# Patient Record
Sex: Female | Born: 1945 | Race: White | Hispanic: No | Marital: Married | Smoking: Never smoker
Health system: Southern US, Community
[De-identification: ages and names within clinical notes are randomized; demographics above are authoritative.]

## PROBLEM LIST (undated history)

## (undated) DIAGNOSIS — R519 Headache, unspecified: Secondary | ICD-10-CM

## (undated) DIAGNOSIS — I10 Essential (primary) hypertension: Secondary | ICD-10-CM

## (undated) DIAGNOSIS — E119 Type 2 diabetes mellitus without complications: Secondary | ICD-10-CM

## (undated) DIAGNOSIS — E785 Hyperlipidemia, unspecified: Secondary | ICD-10-CM

## (undated) HISTORY — DX: Type 2 diabetes mellitus without complications: E11.9

## (undated) HISTORY — DX: Headache, unspecified: R51.9

## (undated) HISTORY — PX: BREAST EXCISIONAL BIOPSY: SUR124

## (undated) HISTORY — PX: ANKLE FRACTURE SURGERY: SHX122

---

## 1999-01-21 ENCOUNTER — Other Ambulatory Visit: Admission: RE | Admit: 1999-01-21 | Discharge: 1999-01-21 | Payer: Self-pay | Admitting: Obstetrics and Gynecology

## 2000-01-09 ENCOUNTER — Encounter: Payer: Self-pay | Admitting: Family Medicine

## 2000-01-09 ENCOUNTER — Ambulatory Visit (HOSPITAL_COMMUNITY): Admission: RE | Admit: 2000-01-09 | Discharge: 2000-01-09 | Payer: Self-pay | Admitting: Family Medicine

## 2000-01-20 ENCOUNTER — Other Ambulatory Visit: Admission: RE | Admit: 2000-01-20 | Discharge: 2000-01-20 | Payer: Self-pay | Admitting: Obstetrics and Gynecology

## 2001-01-21 ENCOUNTER — Other Ambulatory Visit: Admission: RE | Admit: 2001-01-21 | Discharge: 2001-01-21 | Payer: Self-pay | Admitting: Obstetrics and Gynecology

## 2002-01-27 ENCOUNTER — Other Ambulatory Visit: Admission: RE | Admit: 2002-01-27 | Discharge: 2002-01-27 | Payer: Self-pay | Admitting: Obstetrics and Gynecology

## 2003-02-12 ENCOUNTER — Other Ambulatory Visit: Admission: RE | Admit: 2003-02-12 | Discharge: 2003-02-12 | Payer: Self-pay | Admitting: Obstetrics and Gynecology

## 2004-02-15 ENCOUNTER — Other Ambulatory Visit: Admission: RE | Admit: 2004-02-15 | Discharge: 2004-02-15 | Payer: Self-pay | Admitting: Obstetrics and Gynecology

## 2005-02-23 ENCOUNTER — Other Ambulatory Visit: Admission: RE | Admit: 2005-02-23 | Discharge: 2005-02-23 | Payer: Self-pay | Admitting: Obstetrics and Gynecology

## 2008-06-30 ENCOUNTER — Encounter (INDEPENDENT_AMBULATORY_CARE_PROVIDER_SITE_OTHER): Payer: Self-pay | Admitting: *Deleted

## 2009-03-19 ENCOUNTER — Telehealth: Payer: Self-pay | Admitting: Internal Medicine

## 2010-02-01 ENCOUNTER — Encounter (INDEPENDENT_AMBULATORY_CARE_PROVIDER_SITE_OTHER): Payer: Self-pay | Admitting: *Deleted

## 2010-03-03 NOTE — Letter (Signed)
Summary: Pre Visit Letter Revised  [facility]  [address] [place], [place] [address]   Phone: [phone]  Fax: [phone]        02/01/2010 MRN: [id number] Madison Wilson [address] [place], [place]  [address]  [place]             Procedure Date:  03-17-10   Welcome to the Gastroenterology Division at [facility]    You are scheduled to see a nurse for your pre-procedure visit on 03-03-10 at 4:30p.m. on the 3rd floor at [facility], [address]  We ask that you try to arrive at our office 15 minutes prior to your appointment time to allow for check-in.  Please take a minute to review the attached form.  If you answer "Yes" to one or more of the questions on the first page, we ask that you call the person listed at your earliest opportunity.  If you answer "No" to all of the questions, please complete the rest of the form and bring it to your appointment.    Your nurse visit will consist of discussing your medical and surgical history, your immediate family medical history, and your medications.   If you are unable to list all of your medications on the form, please bring the medication bottles to your appointment and we will list them.  We will need to be aware of both prescribed and over the counter drugs.  We will need to know exact dosage information as well.    Please be prepared to read and sign documents such as consent forms, a financial agreement, and acknowledgement forms.  If necessary, and with your consent, a friend or relative is welcome to sit-in on the nurse visit with you.  Please bring your insurance card so that we may make a copy of it.  If your insurance requires a referral to see a specialist, please bring your referral form from your primary care physician.  No co-pay is required for this nurse visit.     If you cannot keep your appointment, please call [phone] to cancel or reschedule prior to your appointment date.  This allows [place]  the opportunity to schedule an appointment for another patient in need of care.    Thank you for choosing  Gastroenterology for your medical needs.  We appreciate the opportunity to care for you.  Please visit [place] at our website  to learn more about our practice.  Sincerely, The Gastroenterology Division

## 2010-03-03 NOTE — Progress Notes (Signed)
Summary: Schedule Colonoscopy  Phone Note Outgoing Call Call back at [facility] Phone [phone]   Call placed by: Harlow Mares CMA Duncan Dull),  March 19, 2009 11:34 AM Call placed to: Patient Summary of Call: Left message on patients machine to call back. patient needs to schedule her colonoscopy Initial call taken by: Harlow Mares CMA Duncan Dull),  March 19, 2009 11:38 AM  Follow-up for Phone Call        Left message on patients machine to call back.  Follow-up by: Harlow Mares CMA Duncan Dull),  March 24, 2009 2:13 PM

## 2011-08-14 ENCOUNTER — Encounter: Payer: Self-pay | Admitting: Internal Medicine

## 2011-12-18 ENCOUNTER — Emergency Department (HOSPITAL_COMMUNITY)
Admission: EM | Admit: 2011-12-18 | Discharge: 2011-12-18 | Disposition: A | Discharge status: Home / Self Care | Payer: Medicare Other | Attending: Emergency Medicine | Admitting: Emergency Medicine

## 2011-12-18 ENCOUNTER — Encounter (HOSPITAL_COMMUNITY): Payer: Self-pay | Admitting: *Deleted

## 2011-12-18 DIAGNOSIS — T4995XA Adverse effect of unspecified topical agent, initial encounter: Secondary | ICD-10-CM | POA: Insufficient documentation

## 2011-12-18 DIAGNOSIS — L500 Allergic urticaria: Secondary | ICD-10-CM | POA: Insufficient documentation

## 2011-12-18 DIAGNOSIS — L509 Urticaria, unspecified: Secondary | ICD-10-CM

## 2011-12-18 DIAGNOSIS — E785 Hyperlipidemia, unspecified: Secondary | ICD-10-CM | POA: Insufficient documentation

## 2011-12-18 DIAGNOSIS — Z79899 Other long term (current) drug therapy: Secondary | ICD-10-CM | POA: Insufficient documentation

## 2011-12-18 HISTORY — DX: Hyperlipidemia, unspecified: E78.5

## 2011-12-18 MED ORDER — FAMOTIDINE IN NACL 20-0.9 MG/50ML-% IV SOLN
20.0000 mg | Freq: Once | INTRAVENOUS | Status: AC
  Administered 2011-12-18: 20 mg via INTRAVENOUS
  Filled 2011-12-18: qty 50

## 2011-12-18 MED ORDER — DIPHENHYDRAMINE HCL 50 MG/ML IJ SOLN
50.0000 mg | Freq: Once | INTRAMUSCULAR | Status: AC
  Administered 2011-12-18: 50 mg via INTRAVENOUS
  Filled 2011-12-18: qty 1

## 2011-12-18 MED ORDER — LORATADINE 10 MG PO TABS: 10.0000 mg | ORAL_TABLET | Freq: Every day | ORAL | Status: DC

## 2011-12-18 MED ORDER — SODIUM CHLORIDE 0.9 % IV BOLUS (SEPSIS)
1000.0000 mL | Freq: Once | INTRAVENOUS | Status: AC
  Administered 2011-12-18: 1000 mL via INTRAVENOUS

## 2011-12-18 MED ORDER — FAMOTIDINE 40 MG PO TABS: 40.0000 mg | ORAL_TABLET | Freq: Two times a day (BID) | ORAL | Status: DC

## 2011-12-18 NOTE — ED Notes (Signed)
Pt sts taking amoxicillin from 12/04/11 to 12/14/11. Sts Friday she began having itching all over and the feeling of a lump in her throat. Was prescribed prednisone for the allergic reaction which made her itching, hives and lump in her throat worse. Reports jitters, chills, nausea and vomiting since. Believes that n/v from medication given to her by her MD to sleep- unable to recall name. Patient presents with hives to groin, hands, throat, and face.

## 2011-12-18 NOTE — ED Notes (Signed)
Patient given discharge instructions, information, prescriptions, and diet order. Patient states that they adequately understand discharge information given and to return to ED if symptoms return or worsen.     

## 2011-12-18 NOTE — ED Provider Notes (Signed)
History     CSN: [id number]  Arrival date & time 12/18/11  1707   First MD Initiated Contact with Patient 12/18/11 1717      Chief Complaint  Patient presents with  . Allergic Reaction    (Consider location/radiation/quality/duration/timing/severity/associated sxs/prior treatment) HPI  Pt to the ER for allergic reaction that has manifested as hives. She had been taking amoxicillin and developed urticaria, she was placed on prednisone and benadryl. She feels that the Prednisone has caused the hives. She went back to her PCP, who said that it was not the prednisone and that she needs to continue to take it. She says she went home, took the medication and "immediatly broke out into hives". Therefore she came to the ER because of the severe itching, feeling of a lump in her throat. No facial swelling. No shortness of breath or difficulty breathing. No other new substances have been ingested that she be lives would be causing this symptoms. HPI from pt and she has none labored speaking. nad vss   Past Medical History  Diagnosis Date  . Hyperlipidemia     Past Surgical History  Procedure Date  . Ankle fracture surgery     No family history on file.  History  Substance Use Topics  . Smoking status: Never Smoker   . Smokeless tobacco: Never Used  . Alcohol Use: No    OB History    Grav Para Term Preterm Abortions TAB SAB Ect Mult Living                  Review of Systems  Review of Systems  Gen: no weight loss, fevers, chills, night sweats  Eyes: no discharge or drainage, no occular pain or visual changes  Nose: no epistaxis or rhinorrhea  Mouth: no dental pain, no sore throat  Neck: no neck pain  Lungs:No wheezing, coughing or hemoptysis CV: no chest pain, palpitations, dependent edema or orthopnea  Abd: no abdominal pain, nausea, vomiting  GU: no dysuria or gross hematuria  MSK:  No abnormalities  Neuro: no headache, no focal neurologic deficits  Skin:  urticaria Psyche: negative.   Allergies  Prednisone; Amoxicillin; and Sulfa antibiotics  Home Medications   Current Outpatient Rx  Name  Route  Sig  Dispense  Refill  . ATORVASTATIN CALCIUM 10 MG PO TABS   Oral   Take 10 mg by mouth daily.         Marland Kitchen CALCIUM PO   Oral   Take 1 tablet by mouth 2 (two) times daily.         Marland Kitchen VITAMIN D 2000 UNITS PO CAPS   Oral   Take 1 capsule by mouth daily.         Marland Kitchen HYDROCORTISONE 1 % EX CREA   Topical   Apply 1 application topically 2 (two) times daily as needed. For itching applied to ears and neck         . HYDROXYZINE HCL 25 MG PO TABS   Oral   Take 25 mg by mouth 3 (three) times daily as needed. For itching.         . ADULT MULTIVITAMIN W/MINERALS CH   Oral   Take 1 tablet by mouth daily.         Marland Kitchen FISH OIL 1000 MG PO CAPS   Oral   Take 1 capsule by mouth daily.         Marland Kitchen PREDNISONE 10 MG PO TABS   Oral  Take 10-50 mg by mouth daily. 5 tabs x 2 days, 4 tabs x 2 days, 3 tabs x 2 days, 2 tabs x 2 days, and 1 tablet for 1 day.         Marland Kitchen FAMOTIDINE 40 MG PO TABS   Oral   Take 1 tablet (40 mg total) by mouth 2 (two) times daily.   14 tablet   0   . LORATADINE 10 MG PO TABS   Oral   Take 1 tablet (10 mg total) by mouth daily.   14 tablet   0     BP 186/80  Pulse 97  Temp 98.9 F (37.2 C) (Oral)  Resp 21  Ht 5\' 1"  (1.549 m)  Wt 146 lb (66.225 kg)  BMI 27.59 kg/m2  SpO2 100%  Physical Exam  Nursing note and vitals reviewed. Constitutional: She appears well-developed and well-nourished. No distress.  HENT:  Head: Normocephalic and atraumatic.  Eyes: Pupils are equal, round, and reactive to light.  Neck: Normal range of motion. Neck supple.  Cardiovascular: Normal rate and regular rhythm.   Pulmonary/Chest: Effort normal.  Abdominal: Soft.  Neurological: She is alert.  Skin: Skin is warm and dry. Rash noted. Rash is urticarial (most dense bilateral ears and neck. also noted on bilateral  thighs).    ED Course  Procedures (including critical care time)  Labs Reviewed - No data to display No results found.   1. Urticaria       MDM  No compromise to airway. No angioedema. Pt given 50mg  IV Benadryl and IV Pepcid with a Liter bolus of fluids,  Pt monitored for 3 hours in the ED while symptoms resolved. Discussed case with Dr. Radford Pax. Pt sent home asked to take Claritin once a day, Pepcid, and Benadryl for the itching as needed. She is to follow-up with her PCP in the next 24-48 hours.  Pt has been advised of the symptoms that warrant their return to the ED. Patient has voiced understanding and has agreed to follow-up with the PCP or specialist.         Dorthula Matas, PA 12/18/11 1921

## 2011-12-18 NOTE — ED Provider Notes (Signed)
Medical screening examination/treatment/procedure(s) were performed by non-physician practitioner and as supervising physician I was immediately available for consultation/collaboration.    Nelia Shi, MD 12/18/11 [phone]

## 2011-12-20 ENCOUNTER — Emergency Department (HOSPITAL_COMMUNITY)
Admission: EM | Admit: 2011-12-20 | Discharge: 2011-12-20 | Disposition: A | Discharge status: Home / Self Care | Payer: Medicare Other | Attending: Emergency Medicine | Admitting: Emergency Medicine

## 2011-12-20 DIAGNOSIS — E785 Hyperlipidemia, unspecified: Secondary | ICD-10-CM | POA: Insufficient documentation

## 2011-12-20 DIAGNOSIS — Z79899 Other long term (current) drug therapy: Secondary | ICD-10-CM | POA: Insufficient documentation

## 2011-12-20 DIAGNOSIS — L299 Pruritus, unspecified: Secondary | ICD-10-CM | POA: Insufficient documentation

## 2011-12-20 DIAGNOSIS — Y929 Unspecified place or not applicable: Secondary | ICD-10-CM | POA: Insufficient documentation

## 2011-12-20 DIAGNOSIS — Y939 Activity, unspecified: Secondary | ICD-10-CM | POA: Insufficient documentation

## 2011-12-20 DIAGNOSIS — L500 Allergic urticaria: Secondary | ICD-10-CM | POA: Insufficient documentation

## 2011-12-20 DIAGNOSIS — R21 Rash and other nonspecific skin eruption: Secondary | ICD-10-CM | POA: Insufficient documentation

## 2011-12-20 DIAGNOSIS — H938X9 Other specified disorders of ear, unspecified ear: Secondary | ICD-10-CM | POA: Insufficient documentation

## 2011-12-20 DIAGNOSIS — H9209 Otalgia, unspecified ear: Secondary | ICD-10-CM | POA: Insufficient documentation

## 2011-12-20 DIAGNOSIS — T3691XA Poisoning by unspecified systemic antibiotic, accidental (unintentional), initial encounter: Secondary | ICD-10-CM | POA: Insufficient documentation

## 2011-12-20 DIAGNOSIS — T783XXA Angioneurotic edema, initial encounter: Secondary | ICD-10-CM | POA: Insufficient documentation

## 2011-12-20 LAB — CBC WITH DIFFERENTIAL/PLATELET
Basophils Absolute: 0 10*3/uL (ref 0.0–0.1)
Eosinophils Relative: 2 % (ref 0–5)
Lymphocytes Relative: 29 % (ref 12–46)
MCV: 88.3 fL (ref 78.0–100.0)
Neutrophils Relative %: 61 % (ref 43–77)
Platelets: 351 10*3/uL (ref 150–400)
RDW: 12.9 % (ref 11.5–15.5)
WBC: 12 10*3/uL — ABNORMAL HIGH (ref 4.0–10.5)

## 2011-12-20 LAB — URINALYSIS, ROUTINE W REFLEX MICROSCOPIC
Bilirubin Urine: NEGATIVE
Hgb urine dipstick: NEGATIVE
Specific Gravity, Urine: 1.013 (ref 1.005–1.030)
pH: 7 (ref 5.0–8.0)

## 2011-12-20 LAB — COMPREHENSIVE METABOLIC PANEL
ALT: 16 U/L (ref 0–35)
AST: 15 U/L (ref 0–37)
CO2: 23 mEq/L (ref 19–32)
Calcium: 9 mg/dL (ref 8.4–10.5)
GFR calc non Af Amer: >90 mL/min (ref >90)
Sodium: 138 mEq/L (ref 135–145)
Total Protein: 6.7 g/dL (ref 6.0–8.3)

## 2011-12-20 MED ORDER — DEXAMETHASONE SODIUM PHOSPHATE 10 MG/ML IJ SOLN
10.0000 mg | Freq: Once | INTRAMUSCULAR | Status: AC
  Administered 2011-12-20: 10 mg via INTRAMUSCULAR
  Filled 2011-12-20: qty 1

## 2011-12-20 MED ORDER — FAMOTIDINE IN NACL 20-0.9 MG/50ML-% IV SOLN
20.0000 mg | Freq: Once | INTRAVENOUS | Status: AC
  Administered 2011-12-20: 20 mg via INTRAVENOUS
  Filled 2011-12-20: qty 50

## 2011-12-20 MED ORDER — DIPHENHYDRAMINE HCL 50 MG/ML IJ SOLN
50.0000 mg | Freq: Once | INTRAMUSCULAR | Status: AC
  Administered 2011-12-20: 50 mg via INTRAVENOUS
  Filled 2011-12-20: qty 1

## 2011-12-20 MED ORDER — HYDROXYZINE HCL 25 MG PO TABS: 25.0000 mg | ORAL_TABLET | Freq: Four times a day (QID) | ORAL | Status: DC

## 2011-12-20 NOTE — ED Notes (Signed)
PA at bedside.

## 2011-12-20 NOTE — ED Notes (Signed)
Pt sts she woke up itching and with oral swelling.pt is hypertensive. Pt is alert and orinated, maintaining a good air way.

## 2011-12-20 NOTE — ED Provider Notes (Signed)
History     CSN: [id number]  Arrival date & time 12/20/11  4098   First MD Initiated Contact with Patient 12/20/11 [phone]      Chief Complaint  Patient presents with  . Allergic Reaction    (Consider location/radiation/quality/duration/timing/severity/associated sxs/prior treatment) HPI Comments: Madison Wilson 66 y.o. female   The chief complaint is: Patient presents with:   Allergic Reaction    Patient with DX strep throat on Mon. Nov. 4.   Given AMOX. Finished course on the Nov. 14. Fri, Nov 15 went to DR. With low grade fever and malaise; dxed with virus. That night began with rash on neck, rash became generalized with itching. Went to walk in clinic and given "shot" with some sxs resolution and prednisone taper.   Took prednisone and Rash became much worse with + lip sweling, eye swelling, ear swelling and throat involvement. Seen here at [facility] mon. Given IV benadryl and pepcid with relief.  Patient returns today with continued urticarial rash (worst on hips), angioedema, and ear pain . Continues to take lipitor, Vit. D, claritin and pepcid.  No wheezing , difficulty breathing or swallowing. Slight headache.  Denies sore throat or URI sxs.  Denies fevers, chills, myalgias, arthralgias. Denies DOE, SOB, chest tightness or pressure, radiation to left arm, jaw or back, or diaphoresis. Denies dysuria, flank pain, suprapubic pain, frequency, urgency, or hematuria. Denies headaches, light headedness, weakness, visual disturbances. Denies abdominal pain, nausea, vomiting, diarrhea or constipation.       Patient is a 66 y.o. female presenting with allergic reaction. The history is provided by the patient. No language interpreter was used.  Allergic Reaction The primary symptoms are  rash, angioedema and urticaria. The primary symptoms do not include wheezing, shortness of breath, cough, abdominal pain, nausea, vomiting, diarrhea, dizziness, palpitations or altered mental status. The  current episode started more than 2 days ago (sterp Nov. 4). The problem has been gradually worsening.  The rash is associated with itching.  The angioedema is not associated with shortness of breath.   The onset of the reaction was associated with a new medication. Significant symptoms also include itching.    Past Medical History  Diagnosis Date  . Hyperlipidemia     Past Surgical History  Procedure Date  . Ankle fracture surgery     No family history on file.  History  Substance Use Topics  . Smoking status: Never Smoker   . Smokeless tobacco: Never Used  . Alcohol Use: No    OB History    Grav Para Term Preterm Abortions TAB SAB Ect Mult Living                  Review of Systems  Respiratory: Negative for cough, shortness of breath and wheezing.   Cardiovascular: Negative for palpitations.  Gastrointestinal: Negative for nausea, vomiting, abdominal pain and diarrhea.  Skin: Positive for itching and rash.  Neurological: Negative for dizziness.  Psychiatric/Behavioral: Negative for altered mental status.  All other systems reviewed and are negative.    Allergies  Prednisone; Amoxicillin; and Sulfa antibiotics  Home Medications   Current Outpatient Rx  Name  Route  Sig  Dispense  Refill  . ATORVASTATIN CALCIUM 10 MG PO TABS   Oral   Take 10 mg by mouth daily.         Marland Kitchen CALCIUM PO   Oral   Take 1 tablet by mouth 2 (two) times daily.         Marland Kitchen  VITAMIN D 2000 UNITS PO CAPS   Oral   Take 1 capsule by mouth daily.         Marland Kitchen FAMOTIDINE 40 MG PO TABS   Oral   Take 1 tablet (40 mg total) by mouth 2 (two) times daily.   14 tablet   0   . HYDROCORTISONE 1 % EX CREA   Topical   Apply 1 application topically 2 (two) times daily as needed. For itching applied to ears and neck         . HYDROXYZINE HCL 25 MG PO TABS   Oral   Take 25 mg by mouth 3 (three) times daily as needed. For itching.         Marland Kitchen LORATADINE 10 MG PO TABS   Oral   Take 1  tablet (10 mg total) by mouth daily.   14 tablet   0   . ADULT MULTIVITAMIN W/MINERALS CH   Oral   Take 1 tablet by mouth daily.         Marland Kitchen FISH OIL 1000 MG PO CAPS   Oral   Take 1 capsule by mouth daily.         Marland Kitchen PREDNISONE 10 MG PO TABS   Oral   Take 10-50 mg by mouth daily. 5 tabs x 2 days, 4 tabs x 2 days, 3 tabs x 2 days, 2 tabs x 2 days, and 1 tablet for 1 day.           BP 189/83  Pulse 82  Temp 98.7 F (37.1 C) (Oral)  Resp 18  SpO2 98%  Physical Exam  Nursing note and vitals reviewed. Constitutional: She is oriented to person, place, and time. She appears well-developed and well-nourished. No distress.  HENT:  Head: Normocephalic and atraumatic. No trismus in the jaw.  Right Ear: Tympanic membrane normal. There is swelling.  Left Ear: Tympanic membrane normal. There is swelling.  Mouth/Throat: Uvula is midline and oropharynx is clear and moist. No uvula swelling. No posterior oropharyngeal edema or posterior oropharyngeal erythema.    Eyes: Conjunctivae normal are normal. No scleral icterus.  Neck: Normal range of motion.  Cardiovascular: Normal rate, regular rhythm and normal heart sounds.  Exam reveals no gallop and no friction rub.   No murmur heard. Pulmonary/Chest: Effort normal and breath sounds normal. No respiratory distress.  Abdominal: Soft. Bowel sounds are normal. She exhibits no distension and no mass. There is no tenderness. There is no guarding.  Neurological: She is alert and oriented to person, place, and time.  Skin: Skin is warm and dry. She is not diaphoretic.       Few urticarial wheels around the waist and inguinal region.  Worst wear underwear line sits against skin.      ED Course  Procedures (including critical care time)  Labs Reviewed  CBC WITH DIFFERENTIAL - Abnormal; Notable for the following:    WBC 12.0 (*)     All other components within normal limits  COMPREHENSIVE METABOLIC PANEL - Abnormal; Notable for the  following:    Potassium 3.2 (*)     Glucose, Bld 111 (*)     All other components within normal limits  URINALYSIS, ROUTINE W REFLEX MICROSCOPIC   No results found.   1. Angioedema of lips       MDM  7:57 AM BP 171/74  Pulse 82  Temp 98.7 F (37.1 C) (Oral)  Resp 16  SpO2 98% Patient with mild HTN, angioedema,  and ear edema.   WIll admin IV pepcid, Benadryl, and will try dexamethasone.  I have asked nursing staff to keep a close eye on the patient.   Filed Vitals:   12/20/11 0551 12/20/11 0600 12/20/11 0740 12/20/11 1056  BP: 193/81 189/83 171/74 158/81  Pulse: 76 82 82 79  Temp: 98.7 F (37.1 C)     TempSrc: Oral     Resp: 18  16   SpO2: 98% 98% 98% 98%   Patient with some resolution of lip swelling. Itching is decreased.  VSS. Airway patent throughout. CV: RRR, No M/R/G, Peripheral pulses intact. No peripheral edema. Lungs: CTAB, no wheezing or stridor Abd: Soft, Non tender, non distended   Will dc with atarax.  Continue famotidine and claritin FU PCP and allergist Discussed reasons to seek immediate care. Patient expresses understanding and agrees with plan.       Arthor Captain, PA-C 12/20/11 1641

## 2011-12-22 NOTE — ED Provider Notes (Signed)
Medical screening examination/treatment/procedure(s) were performed by non-physician practitioner and as supervising physician I was immediately available for consultation/collaboration.  Marwan T Powers, MD 12/22/11 1650 

## 2012-03-29 ENCOUNTER — Other Ambulatory Visit: Payer: Self-pay | Admitting: Dermatology

## 2012-11-05 ENCOUNTER — Other Ambulatory Visit: Payer: Self-pay | Admitting: Obstetrics and Gynecology

## 2012-11-05 DIAGNOSIS — N6001 Solitary cyst of right breast: Secondary | ICD-10-CM

## 2012-11-08 ENCOUNTER — Other Ambulatory Visit: Payer: Self-pay | Admitting: Obstetrics and Gynecology

## 2012-11-08 ENCOUNTER — Ambulatory Visit
Admission: RE | Admit: 2012-11-08 | Discharge: 2012-11-08 | Disposition: A | Discharge status: Home / Self Care | Payer: Medicare Other | Source: Ambulatory Visit | Attending: Obstetrics and Gynecology | Admitting: Obstetrics and Gynecology

## 2012-11-08 DIAGNOSIS — N6001 Solitary cyst of right breast: Secondary | ICD-10-CM

## 2012-11-11 ENCOUNTER — Ambulatory Visit
Admission: RE | Admit: 2012-11-11 | Discharge: 2012-11-11 | Disposition: A | Discharge status: Home / Self Care | Payer: 59 | Source: Ambulatory Visit | Attending: Family Medicine | Admitting: Family Medicine

## 2012-11-11 ENCOUNTER — Other Ambulatory Visit: Payer: Self-pay | Admitting: Family Medicine

## 2012-11-11 DIAGNOSIS — J209 Acute bronchitis, unspecified: Secondary | ICD-10-CM

## 2012-11-11 IMAGING — CR DG CHEST 2V
2 series · 2 of 2 positions shown · non-contrast
Comparison: None.

CLINICAL DATA: Persistent cough. Acute bronchitis.

EXAM:
CHEST  2 VIEW

[view not recorded (1 of 2)]
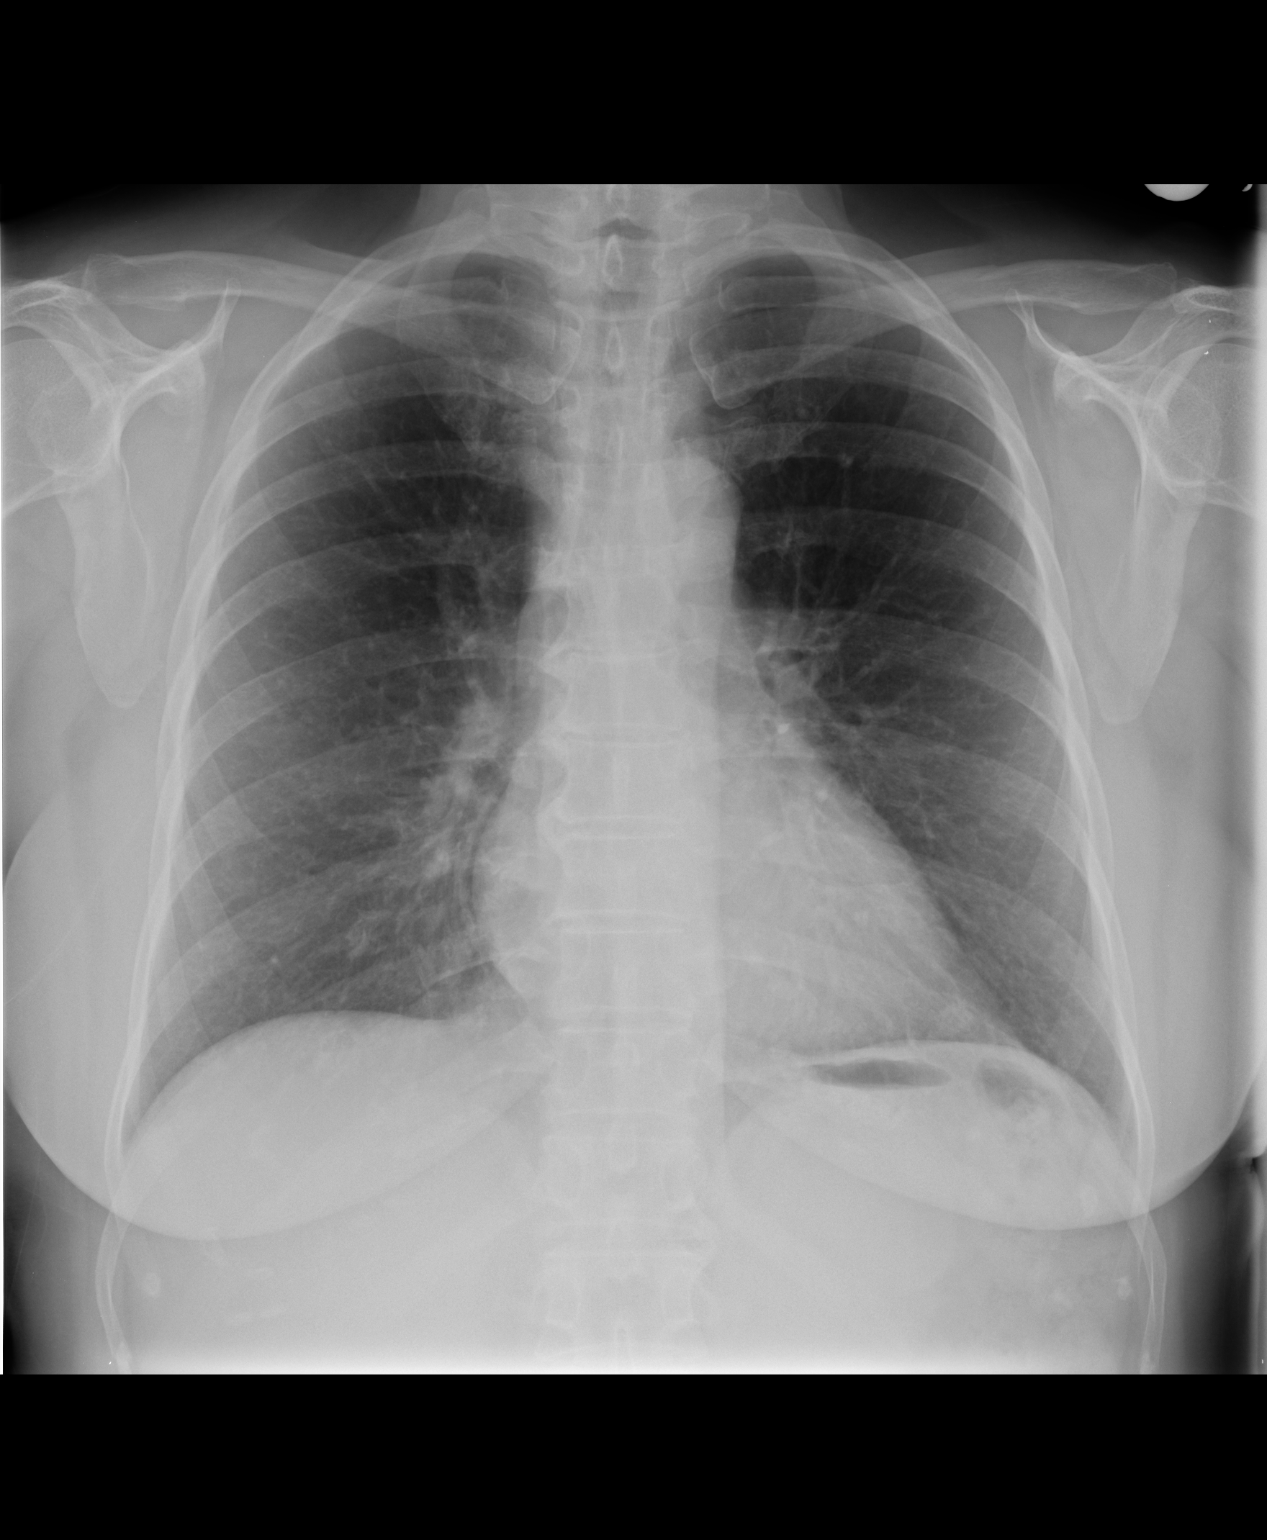

[view not recorded (2 of 2)]
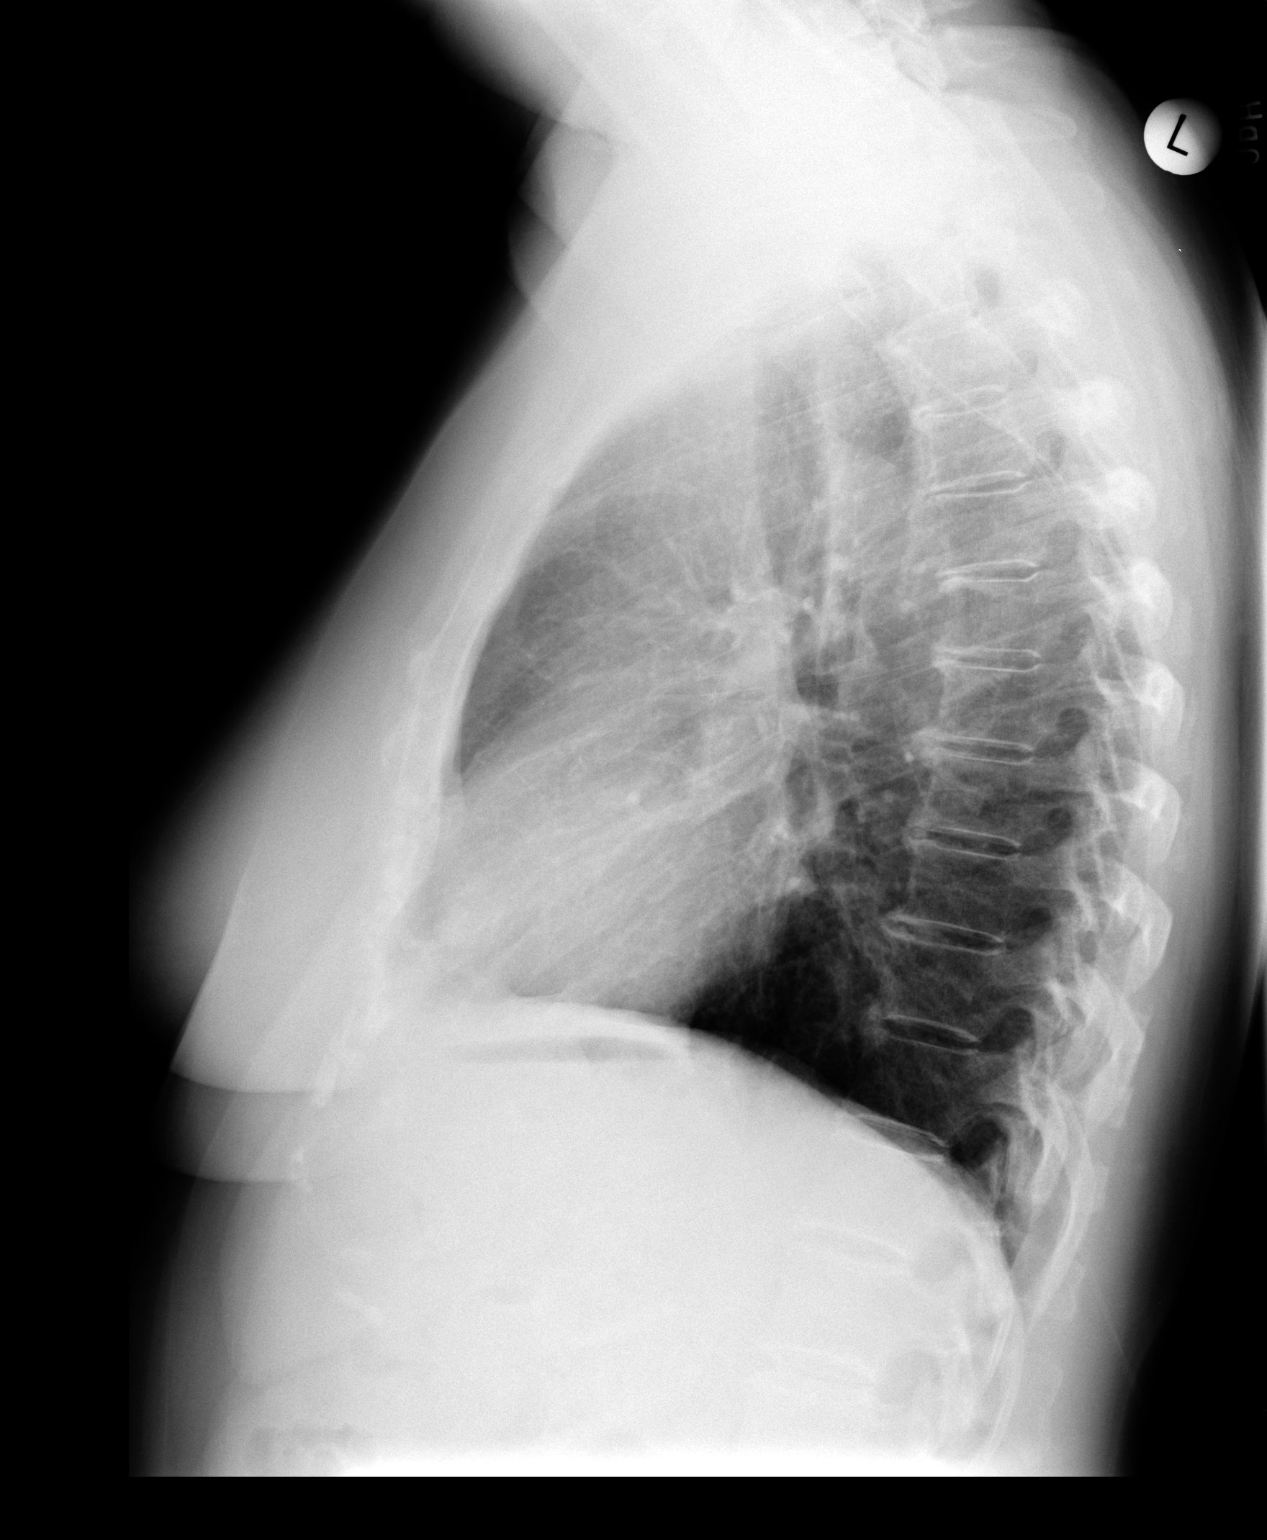

[2 of 2 positions shown; findings below may reference images not displayed]

FINDINGS: There is slight accentuation of the interstitial markings at the
right base medially without consolidation. Slight peribronchial
thickening.

Heart size and vascularity are normal. No effusions. No osseous
abnormality of significance.
IMPRESSION: Bronchitic changes primarily at the right base.

## 2012-11-13 ENCOUNTER — Other Ambulatory Visit: Payer: Self-pay | Admitting: Obstetrics and Gynecology

## 2012-11-13 ENCOUNTER — Ambulatory Visit
Admission: RE | Admit: 2012-11-13 | Discharge: 2012-11-13 | Disposition: A | Discharge status: Home / Self Care | Payer: 59 | Source: Ambulatory Visit | Attending: Obstetrics and Gynecology | Admitting: Obstetrics and Gynecology

## 2012-11-13 DIAGNOSIS — N6001 Solitary cyst of right breast: Secondary | ICD-10-CM

## 2012-11-13 HISTORY — PX: BREAST CYST ASPIRATION: SHX578

## 2013-04-07 ENCOUNTER — Other Ambulatory Visit: Payer: Self-pay | Admitting: Obstetrics and Gynecology

## 2013-04-07 DIAGNOSIS — N6009 Solitary cyst of unspecified breast: Secondary | ICD-10-CM

## 2013-05-15 ENCOUNTER — Ambulatory Visit
Admission: RE | Admit: 2013-05-15 | Discharge: 2013-05-15 | Disposition: A | Discharge status: Home / Self Care | Payer: 59 | Source: Ambulatory Visit | Attending: Obstetrics and Gynecology | Admitting: Obstetrics and Gynecology

## 2013-05-15 DIAGNOSIS — N6009 Solitary cyst of unspecified breast: Secondary | ICD-10-CM

## 2013-05-16 ENCOUNTER — Other Ambulatory Visit: Payer: Self-pay

## 2013-05-16 DIAGNOSIS — Z1231 Encounter for screening mammogram for malignant neoplasm of breast: Secondary | ICD-10-CM

## 2013-11-03 ENCOUNTER — Ambulatory Visit
Admission: RE | Admit: 2013-11-03 | Discharge: 2013-11-03 | Disposition: A | Discharge status: Home / Self Care | Payer: 59 | Source: Ambulatory Visit

## 2013-11-03 DIAGNOSIS — Z1231 Encounter for screening mammogram for malignant neoplasm of breast: Secondary | ICD-10-CM

## 2013-11-14 ENCOUNTER — Ambulatory Visit (INDEPENDENT_AMBULATORY_CARE_PROVIDER_SITE_OTHER): Payer: Medicare Other | Admitting: Family Medicine

## 2013-11-14 ENCOUNTER — Encounter: Payer: Self-pay | Admitting: Family Medicine

## 2013-11-14 ENCOUNTER — Encounter (INDEPENDENT_AMBULATORY_CARE_PROVIDER_SITE_OTHER): Payer: Self-pay

## 2013-11-14 VITALS — BP 149/79 | HR 69 | Temp 98.8°F | Ht 61.0 in | Wt 153.0 lb

## 2013-11-14 DIAGNOSIS — J029 Acute pharyngitis, unspecified: Secondary | ICD-10-CM

## 2013-11-14 DIAGNOSIS — J208 Acute bronchitis due to other specified organisms: Secondary | ICD-10-CM

## 2013-11-14 LAB — POCT RAPID STREP A (OFFICE): Rapid Strep A Screen: POSITIVE — AB

## 2013-11-14 MED ORDER — AZITHROMYCIN 250 MG PO TABS: ORAL_TABLET | ORAL | Status: DC

## 2013-11-14 MED ORDER — HYDROCODONE-HOMATROPINE 5-1.5 MG/5ML PO SYRP: 5.0000 mL | ORAL_SOLUTION | Freq: Three times a day (TID) | ORAL | Status: DC | PRN

## 2013-11-14 NOTE — Progress Notes (Signed)
   Subjective:    Patient ID: Madison Wilson, female    DOB: 1945-04-15, 68 y.o.   MRN: [id number]  HPI This 68 y.o. female presents for evaluation of uri sx's and cough.   Review of Systems No chest pain, SOB, HA, dizziness, vision change, N/V, diarrhea, constipation, dysuria, urinary urgency or frequency, myalgias, arthralgias or rash.     Objective:   Physical Exam Vital signs noted  Well developed well nourished female.  HEENT - Head atraumatic Normocephalic                Eyes - PERRLA, Conjuctiva - clear Sclera- Clear EOMI                Ears - EAC's Wnl TM's Wnl Gross Hearing WNL                Nose - Nares patent                 Throat - oropharanx wnl Respiratory - Lungs CTA bilateral Cardiac - RRR S1 and S2 without murmur GI - Abdomen soft Nontender and bowel sounds active x 4 Extremities - No edema. Neuro - Grossly intact.       Assessment & Plan:  Sore throat - Plan: POCT rapid strep A, HYDROcodone-homatropine (HYCODAN) 5-1.5 MG/5ML syrup, azithromycin (ZITHROMAX) 250 MG tablet  Acute bronchitis due to other specified organisms - Plan: HYDROcodone-homatropine (HYCODAN) 5-1.5 MG/5ML syrup, azithromycin (ZITHROMAX) 250 MG tablet  Deatra CanterWilliam J Lonette Stevison FNP

## 2014-08-26 ENCOUNTER — Other Ambulatory Visit: Payer: Self-pay

## 2014-08-26 DIAGNOSIS — Z1231 Encounter for screening mammogram for malignant neoplasm of breast: Secondary | ICD-10-CM

## 2014-11-05 ENCOUNTER — Ambulatory Visit
Admission: RE | Admit: 2014-11-05 | Discharge: 2014-11-05 | Disposition: A | Discharge status: Home / Self Care | Payer: Medicare Other | Source: Ambulatory Visit

## 2014-11-05 DIAGNOSIS — Z1231 Encounter for screening mammogram for malignant neoplasm of breast: Secondary | ICD-10-CM

## 2014-12-16 ENCOUNTER — Telehealth: Payer: Self-pay | Admitting: Family Medicine

## 2014-12-17 NOTE — Telephone Encounter (Signed)
Pt reported

## 2014-12-30 ENCOUNTER — Encounter: Payer: Self-pay | Admitting: *Deleted

## 2014-12-30 ENCOUNTER — Encounter: Payer: Medicare Other | Attending: Family Medicine | Admitting: *Deleted

## 2014-12-30 VITALS — Ht 61.0 in | Wt 150.5 lb

## 2014-12-30 DIAGNOSIS — Z713 Dietary counseling and surveillance: Secondary | ICD-10-CM | POA: Insufficient documentation

## 2014-12-30 DIAGNOSIS — E119 Type 2 diabetes mellitus without complications: Secondary | ICD-10-CM | POA: Diagnosis not present

## 2014-12-30 NOTE — Progress Notes (Signed)
Diabetes Self-Management Education  Visit Type: First/Initial  Appt. Start Time: 1000 Appt. End Time: 1130  12/30/2014  [name], identified by name and date of birth, is a 69 y.o. female with a diagnosis of Diabetes: Type 2. Madison Wilson presents with a new diagnosis of T2DM. She questions if she really has diabetes as this is the first time and glucose difficulties as been noted.  ASSESSMENT  Height  (1.549 m), weight 150 lb 8 oz (68.266 kg). Body mass index is 28.45 kg/(m^2).      Diabetes Self-Management Education - 12/30/14 1036    Visit Information   Visit Type First/Initial   Initial Visit   Diabetes Type Type 2   Are you currently following a meal plan? No   Are you taking your medications as prescribed? Yes   Date Diagnosed 10/2014   Health Coping   How would you rate your overall health? Excellent   Psychosocial Assessment   Patient Belief/Attitude about Diabetes Motivated to manage diabetes   Self-care barriers Other (comment)  Initially Denial   Self-management support Doctor's office;Family;CDE visits   Other persons present Patient   Patient Concerns Nutrition/Meal planning;Monitoring;Healthy Lifestyle;Glycemic Control   Special Needs None   Preferred Learning Style No preference indicated   Learning Readiness Change in progress   How often do you need to have someone help you when you read instructions, pamphlets, or other written materials from your doctor or pharmacy? 1 - Never   What is the last grade level you completed in school? bachelor   Complications   Last HgB A1C per patient/outside source 6.5 %   How often do you check your blood sugar? 1-2 times/day   Fasting Blood glucose range (mg/dL) 16-109  60-454   Have you had a dilated eye exam in the past 12 months? No   Have you had a dental exam in the past 12 months? Yes   Are you checking your feet? Yes   How many days per week are you checking your feet? 1   Dietary Intake   Breakfast  boiled egg, oatmeal, + blueberries   Snack (morning) none   Lunch Atkins meals, soup 1/2 sandwich   Snack (afternoon) banana / Atkins bar/ carrotts    Dinner vegetables,    Exercise   Exercise Type Light (walking / raking leaves)  Began walking at time of diagnosis. None Prior   How many days per week to you exercise? 4   How many minutes per day do you exercise? 30   Total minutes per week of exercise 120   Patient Education   Previous Diabetes Education No   Disease state  Definition of diabetes, type 1 and 2, and the diagnosis of diabetes;Factors that contribute to the development of diabetes   Nutrition management  Role of diet in the treatment of diabetes and the relationship between the three main macronutrients and blood glucose level;Food label reading, portion sizes and measuring food.;Carbohydrate counting;Meal options for control of blood glucose level and chronic complications.   Physical activity and exercise  Role of exercise on diabetes management, blood pressure control and cardiac health.   Monitoring Purpose and frequency of SMBG.;Identified appropriate SMBG and/or A1C goals.   Chronic complications Relationship between chronic complications and blood glucose control   Psychosocial adjustment Role of stress on diabetes   Individualized Goals (developed by patient)   Nutrition General guidelines for healthy choices and portions discussed   Physical Activity Exercise 3-5 times per week;30  minutes per day   Medications take my medication as prescribed   Monitoring  send in my blood glucose log as discussed   Outcomes   Expected Outcomes Demonstrated interest in learning. Expect positive outcomes   Future DMSE PRN   Program Status Completed      Individualized Plan for Diabetes Self-Management Training:   Learning Objective:  Patient will have a greater understanding of diabetes self-management. Patient education plan is to attend individual and/or group sessions per  assessed needs and concerns.    Patient Instructions  Plan:  Aim for 2-3 Carb Choices per meal (30-45 grams) +/- 1 either way  Aim for 0-15 Carbs per snack if hungry  Include protein in moderation with your meals and snacks Consider reading food labels for Total Carbohydrate and Fat Grams of foods Consider  increasing your activity level by walking for 30 minutes daily as tolerated Continue checking BG at alternate times per day as directed by MD. May include FBS and 2hpp. Continue taking medication as directed by MD  Pasta & Rice - no greater than 1C per meal Avoid fruit juice Consider Greek Yogurt, Protein Bars  Consider having a late evening snack to include carb & protein. This may help prevent Liver giving you extra sugar during the night and elevating your fasting blood sugar.   Expected Outcomes:  Demonstrated interest in learning. Expect positive outcomes  Education material provided: Living Well with Diabetes, A1C conversion sheet, Meal plan card, My Plate and Snack sheet  If problems or questions, patient to contact team via:  Phone  Future DSME appointment: PRN

## 2014-12-30 NOTE — Patient Instructions (Addendum)
Plan:  Aim for 2-3 Carb Choices per meal (30-45 grams) +/- 1 either way  Aim for 0-15 Carbs per snack if hungry  Include protein in moderation with your meals and snacks Consider reading food labels for Total Carbohydrate and Fat Grams of foods Consider  increasing your activity level by walking for 30 minutes daily as tolerated Continue checking BG at alternate times per day as directed by MD. May include FBS and 2hpp. Continue taking medication as directed by MD  Pasta & Rice - no greater than 1C per meal Avoid fruit juice Consider Greek Yogurt, Protein Bars  Consider having a late evening snack to include carb & protein. This may help prevent Liver giving you extra sugar during the night and elevating your fasting blood sugar.

## 2015-07-26 ENCOUNTER — Other Ambulatory Visit: Payer: Self-pay | Admitting: Family Medicine

## 2015-07-26 DIAGNOSIS — Z1231 Encounter for screening mammogram for malignant neoplasm of breast: Secondary | ICD-10-CM

## 2015-10-12 ENCOUNTER — Other Ambulatory Visit: Payer: Self-pay | Admitting: Allergy and Immunology

## 2015-10-12 ENCOUNTER — Ambulatory Visit
Admission: RE | Admit: 2015-10-12 | Discharge: 2015-10-12 | Disposition: A | Discharge status: Home / Self Care | Payer: Medicare Other | Source: Ambulatory Visit | Attending: Allergy and Immunology | Admitting: Allergy and Immunology

## 2015-10-12 DIAGNOSIS — R05 Cough: Secondary | ICD-10-CM

## 2015-10-12 DIAGNOSIS — R059 Cough, unspecified: Secondary | ICD-10-CM

## 2015-10-12 IMAGING — CR DG CHEST 2V
2 series · 2 of 2 positions shown · non-contrast
Comparison: [DATE]

CLINICAL DATA: Chronic cough x 2 years / no fever / worse 2-3 time
per year / non smoker

EXAM:
CHEST  2 VIEW

[w chest pa]
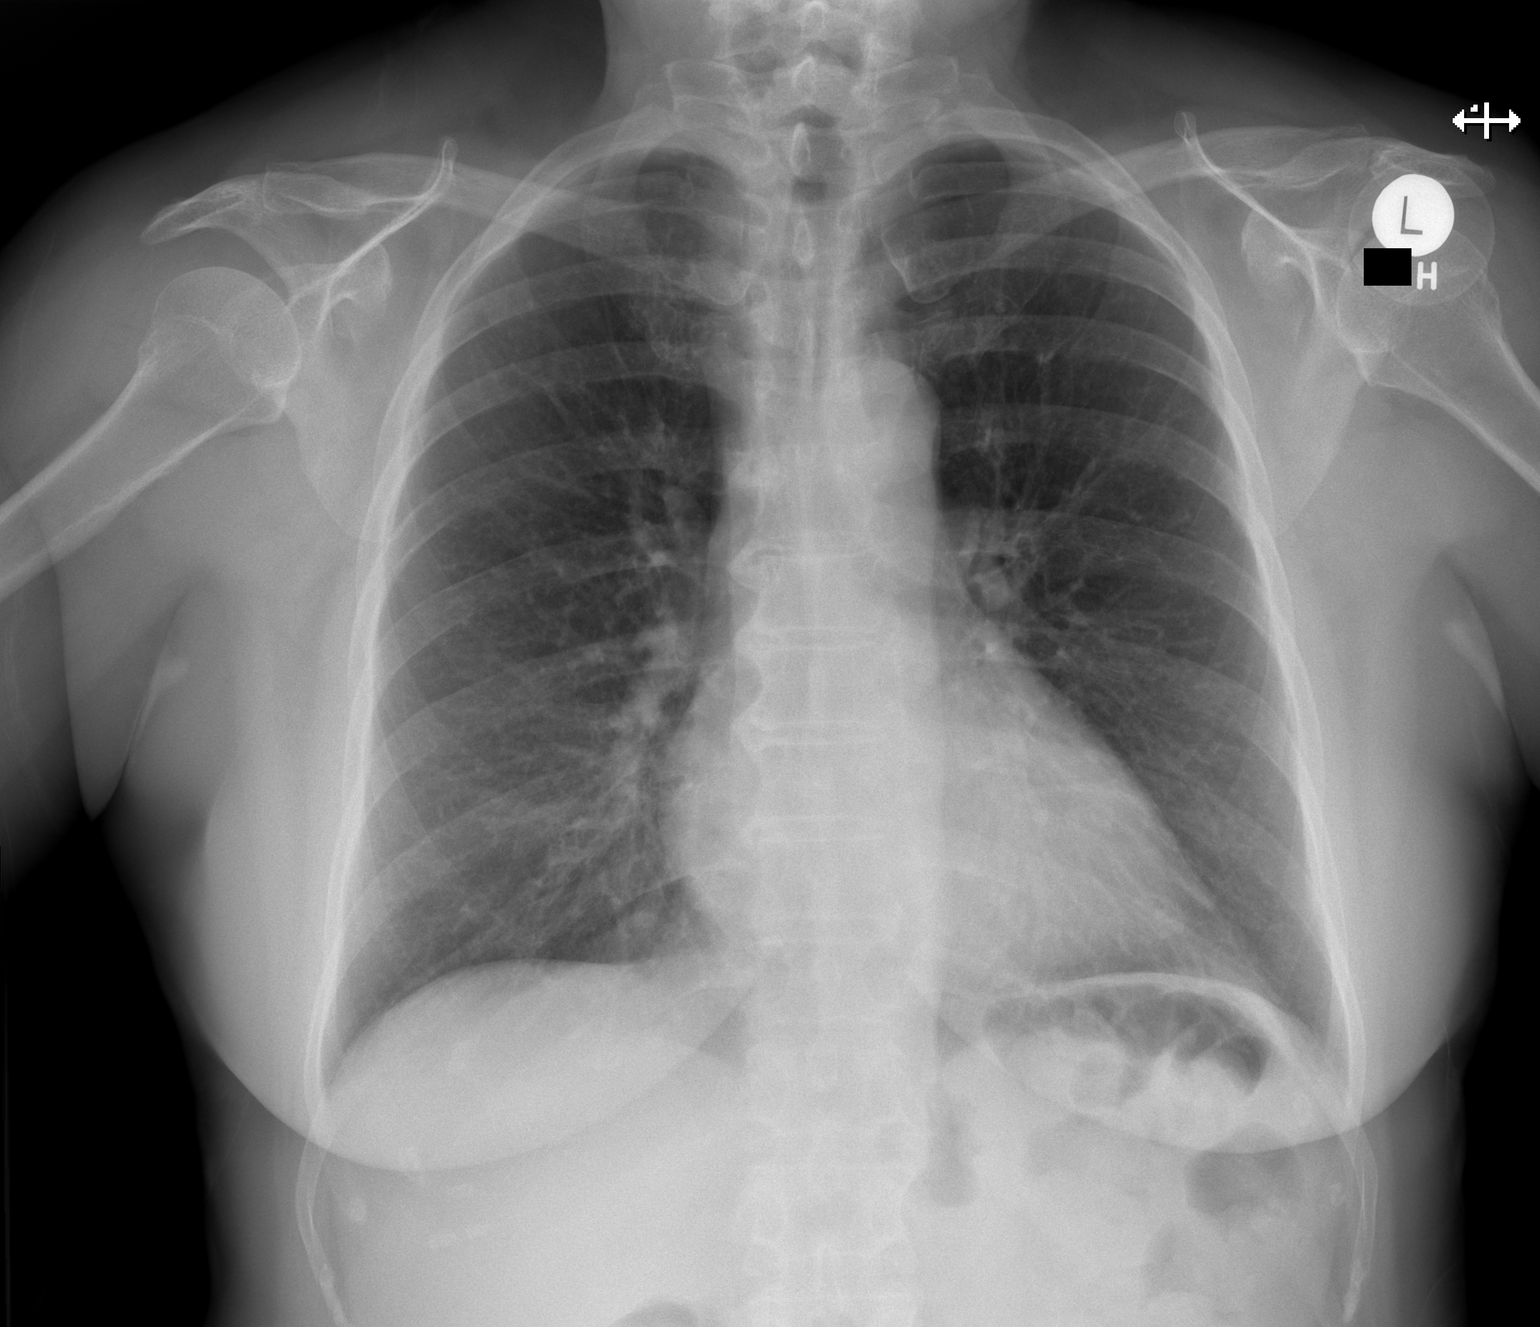

[w chest lat]
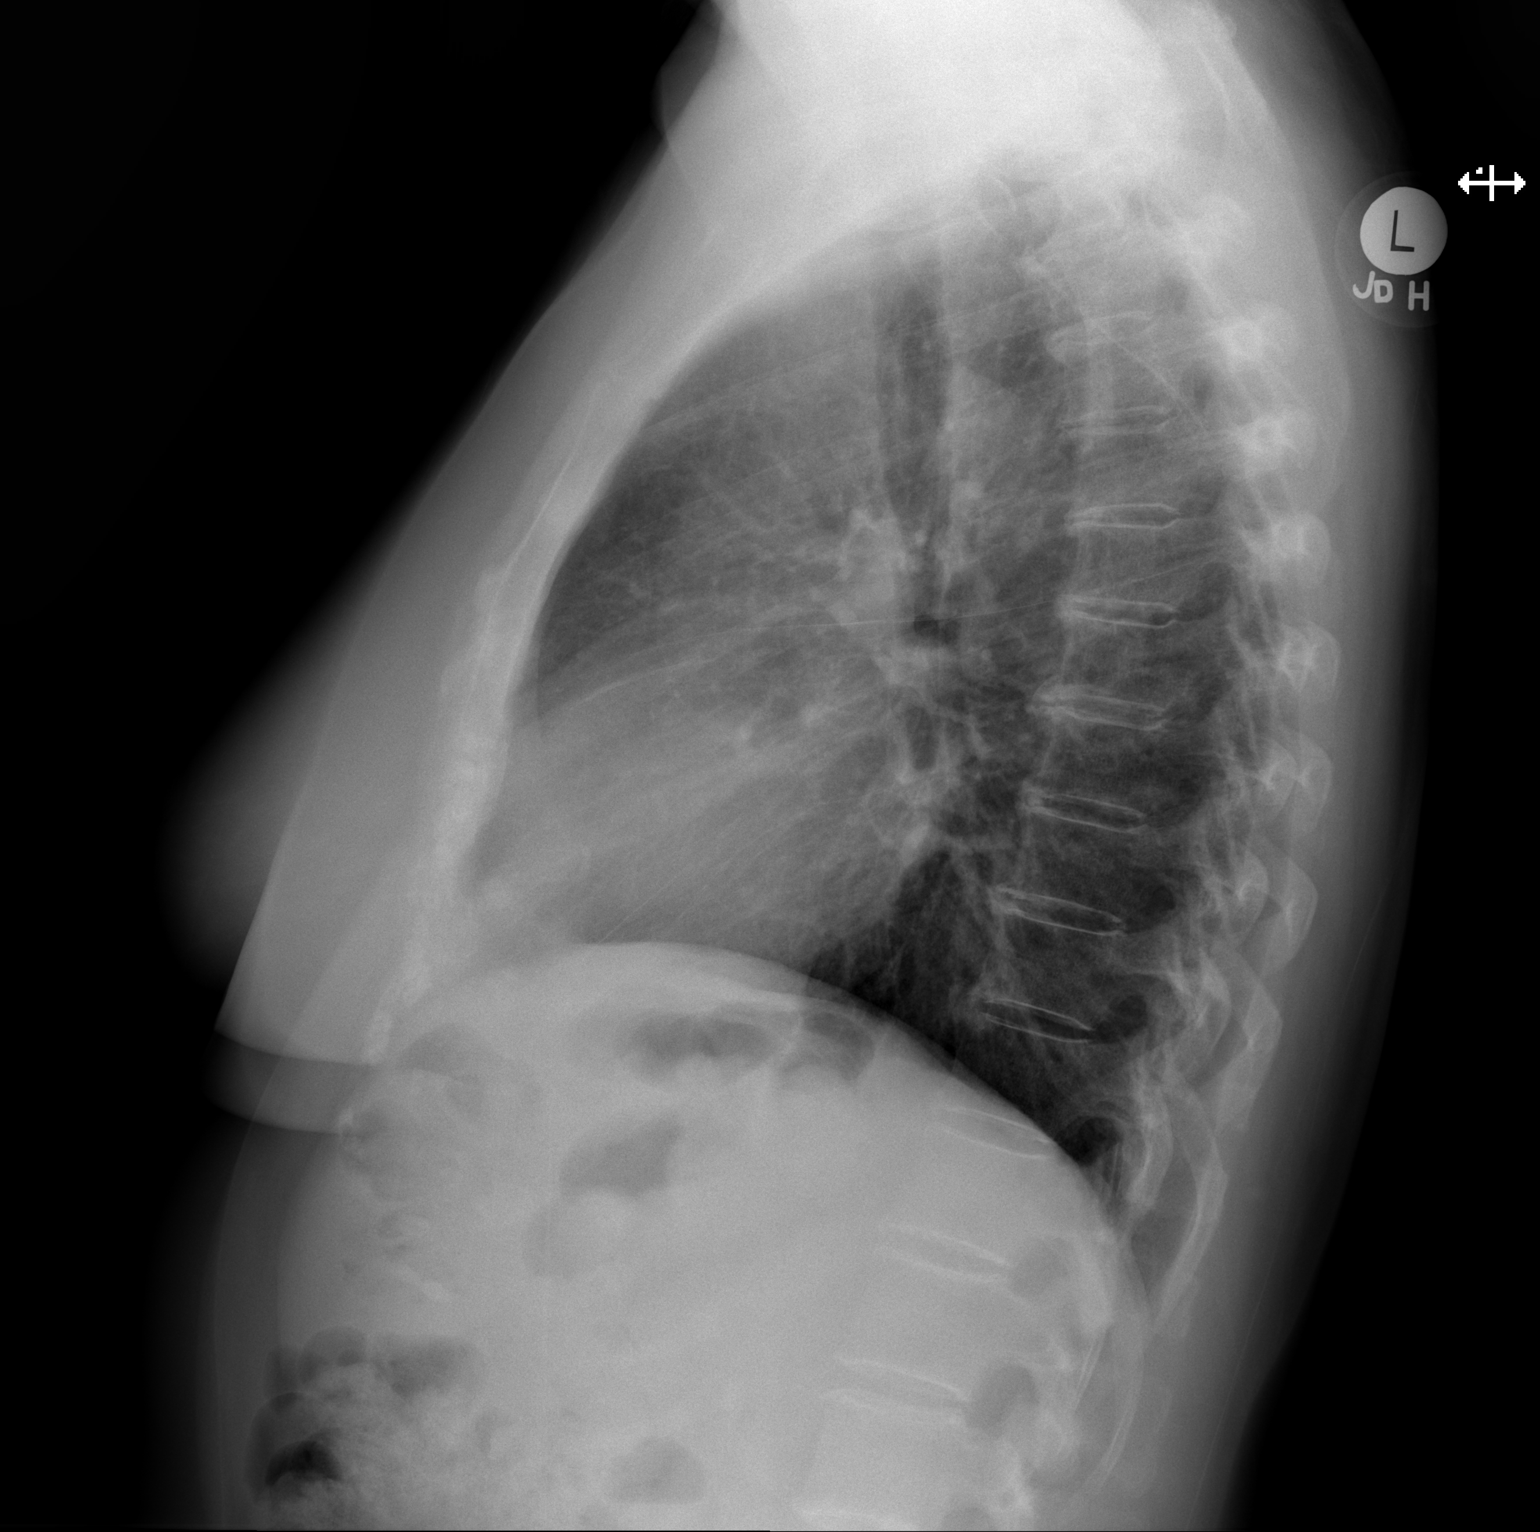

[2 of 2 positions shown; findings below may reference images not displayed]

FINDINGS: The heart size and mediastinal contours are within normal limits.
Both lungs are clear. No pleural effusion or pneumothorax.

Skeletal structures are demineralized. Mild degenerative changes
noted along the thoracic spine.
IMPRESSION: No active cardiopulmonary disease.

## 2015-11-08 ENCOUNTER — Ambulatory Visit
Admission: RE | Admit: 2015-11-08 | Discharge: 2015-11-08 | Disposition: A | Discharge status: Home / Self Care | Payer: Medicare Other | Source: Ambulatory Visit | Attending: Family Medicine | Admitting: Family Medicine

## 2015-11-08 DIAGNOSIS — Z1231 Encounter for screening mammogram for malignant neoplasm of breast: Secondary | ICD-10-CM

## 2015-12-30 ENCOUNTER — Other Ambulatory Visit: Payer: Self-pay | Admitting: Family Medicine

## 2015-12-30 DIAGNOSIS — M7989 Other specified soft tissue disorders: Secondary | ICD-10-CM

## 2016-01-07 ENCOUNTER — Ambulatory Visit
Admission: RE | Admit: 2016-01-07 | Discharge: 2016-01-07 | Disposition: A | Discharge status: Home / Self Care | Payer: Medicare Other | Source: Ambulatory Visit | Attending: Family Medicine | Admitting: Family Medicine

## 2016-01-07 DIAGNOSIS — M7989 Other specified soft tissue disorders: Secondary | ICD-10-CM

## 2016-01-07 IMAGING — US US CHEST/MEDIASTINUM
1 series · 5 of 5 positions shown · non-contrast
Comparison: None.

CLINICAL DATA: Palpable area in upper chest.

EXAM:
CHEST ULTRASOUND

[Series 1: us chest/mediastinum · 0.06mm/px · 5 of 5 slices shown]
[im 1/5]
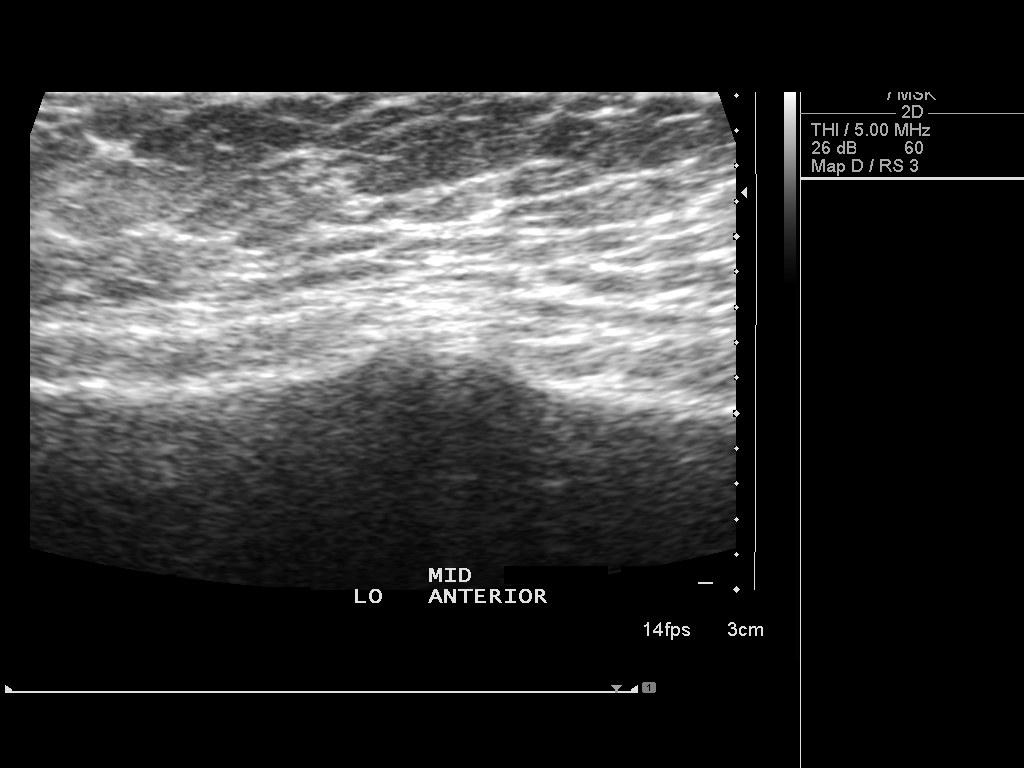
[im 2/5]
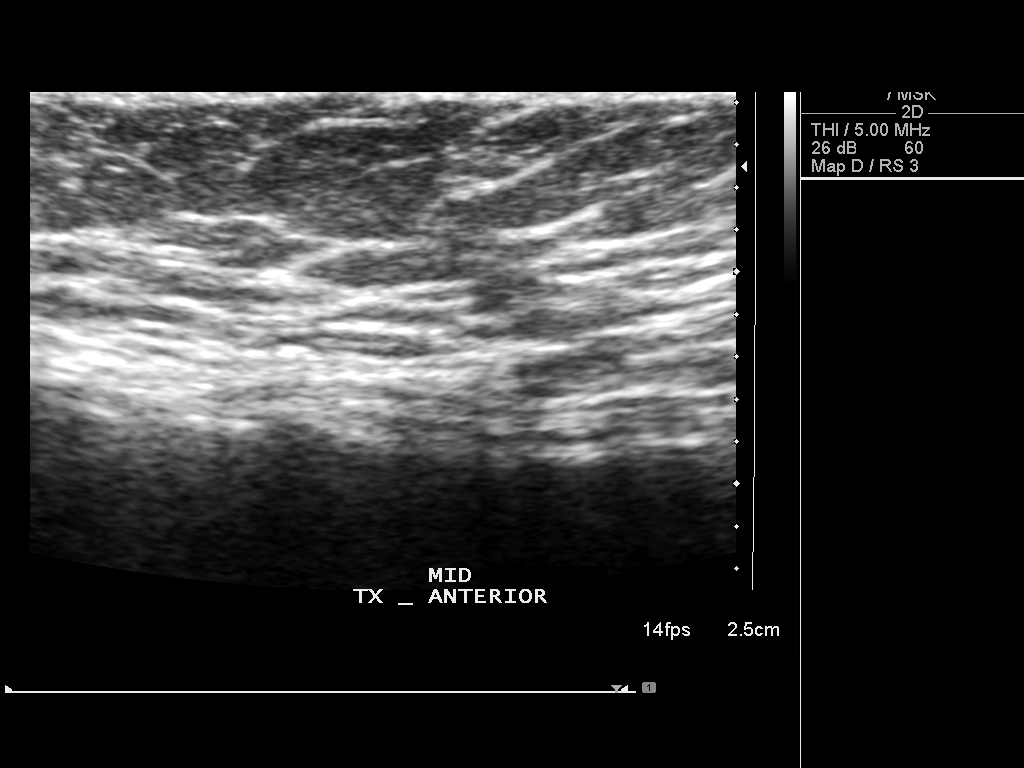
[im 3/5]
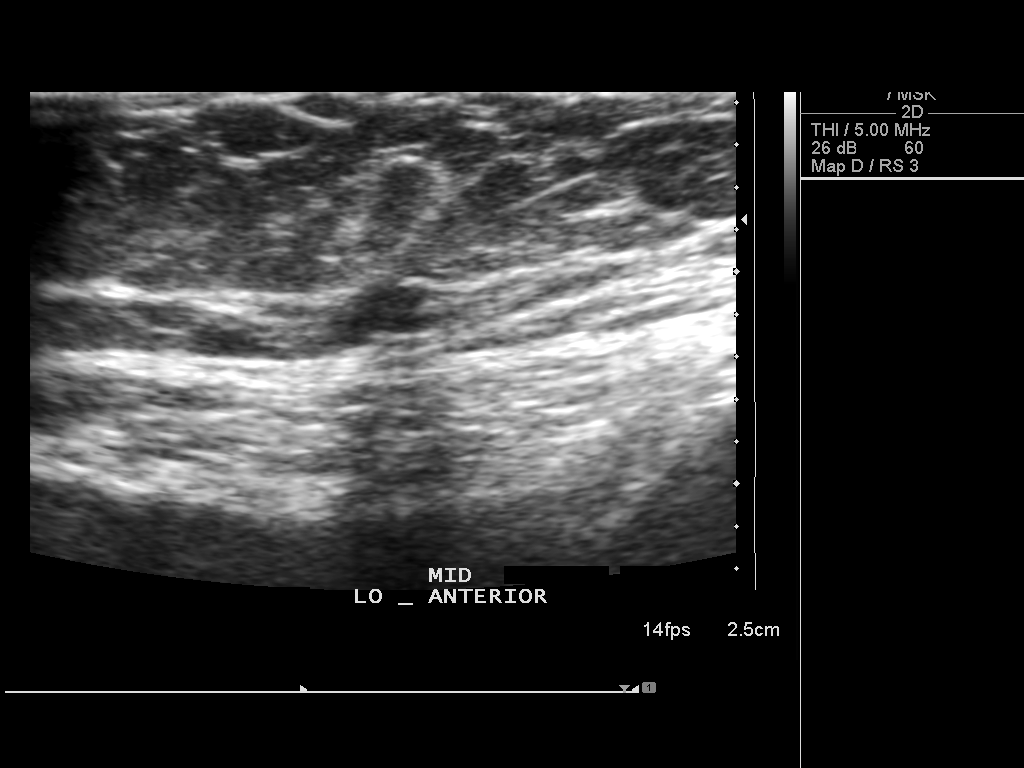
[im 4/5]
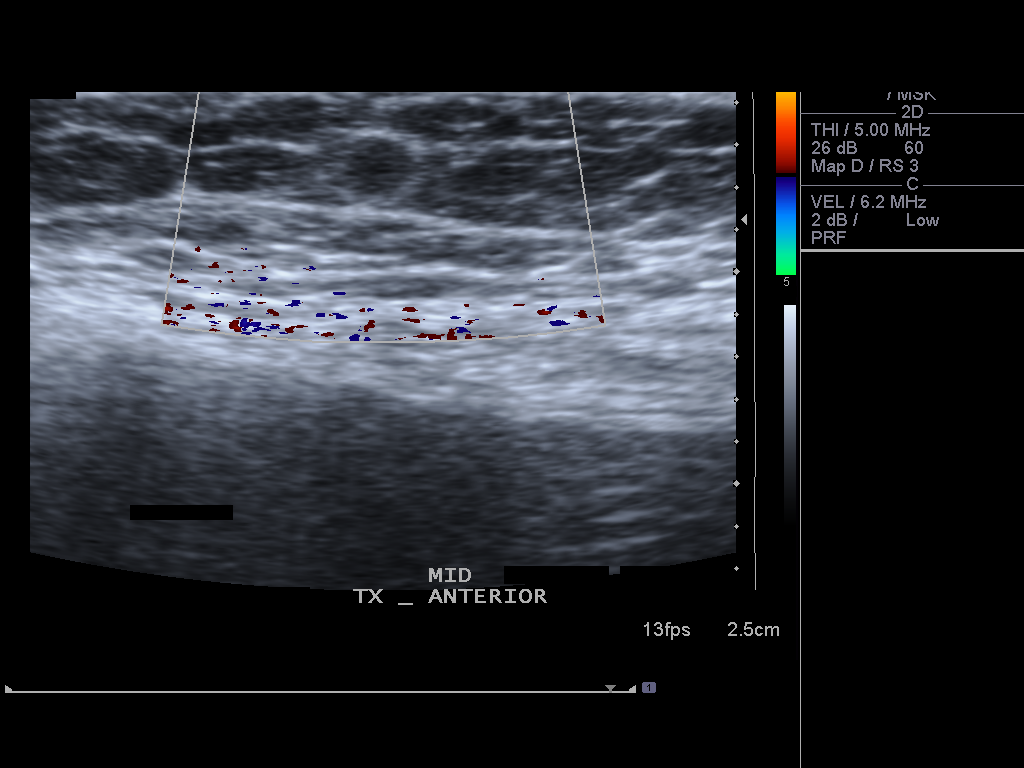
[im 5/5]
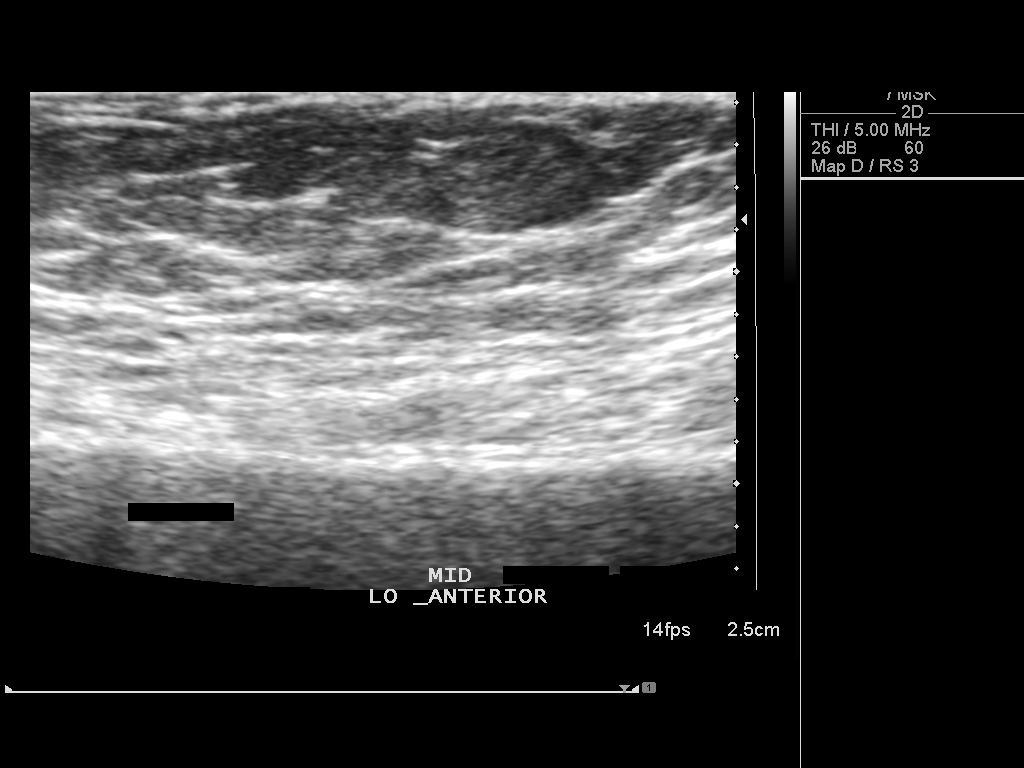

[5 of 5 positions shown; findings below may reference images not displayed]

FINDINGS: No cyst or solid mass seen in the region of palpation.
IMPRESSION: Normal study.  No cause for the patient's symptoms identified.

## 2016-09-28 ENCOUNTER — Other Ambulatory Visit: Payer: Self-pay | Admitting: Family Medicine

## 2016-09-28 DIAGNOSIS — Z1239 Encounter for other screening for malignant neoplasm of breast: Secondary | ICD-10-CM

## 2016-11-08 ENCOUNTER — Ambulatory Visit
Admission: RE | Admit: 2016-11-08 | Discharge: 2016-11-08 | Disposition: A | Discharge status: Home / Self Care | Payer: Medicare Other | Source: Ambulatory Visit | Attending: Family Medicine | Admitting: Family Medicine

## 2016-11-08 DIAGNOSIS — Z1239 Encounter for other screening for malignant neoplasm of breast: Secondary | ICD-10-CM

## 2016-11-08 IMAGING — MG 2D DIGITAL SCREENING BILATERAL MAMMOGRAM WITH CAD AND ADJUNCT TO
9 of 12 series · 9 of 28 positions shown · non-contrast
Comparison: Previous exam(s).

CLINICAL DATA: Screening.

EXAM:
2D DIGITAL SCREENING BILATERAL MAMMOGRAM WITH CAD AND ADJUNCT TOMO

[R MLO synth-2D]
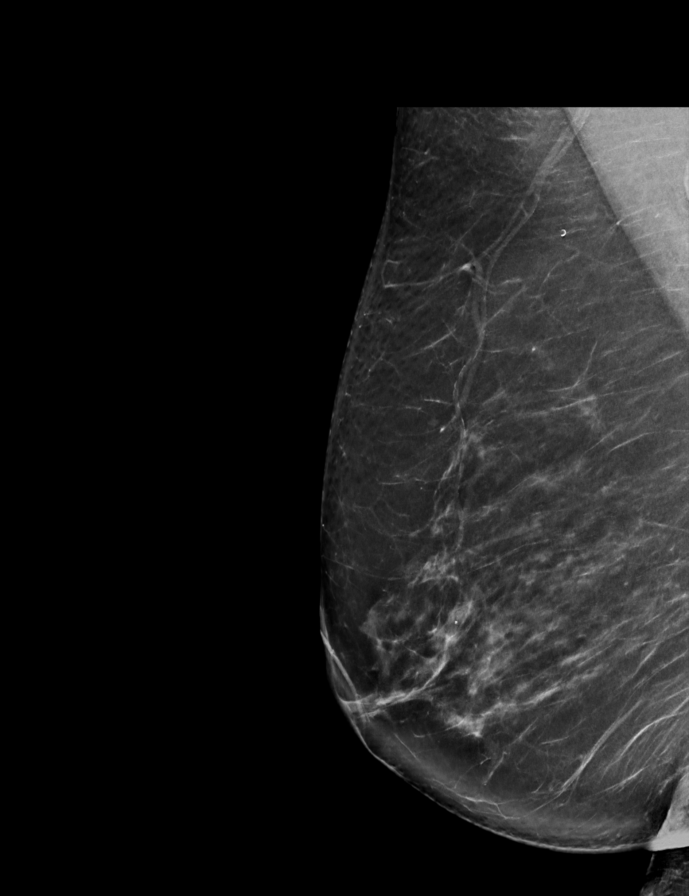

[L CC synth-2D]
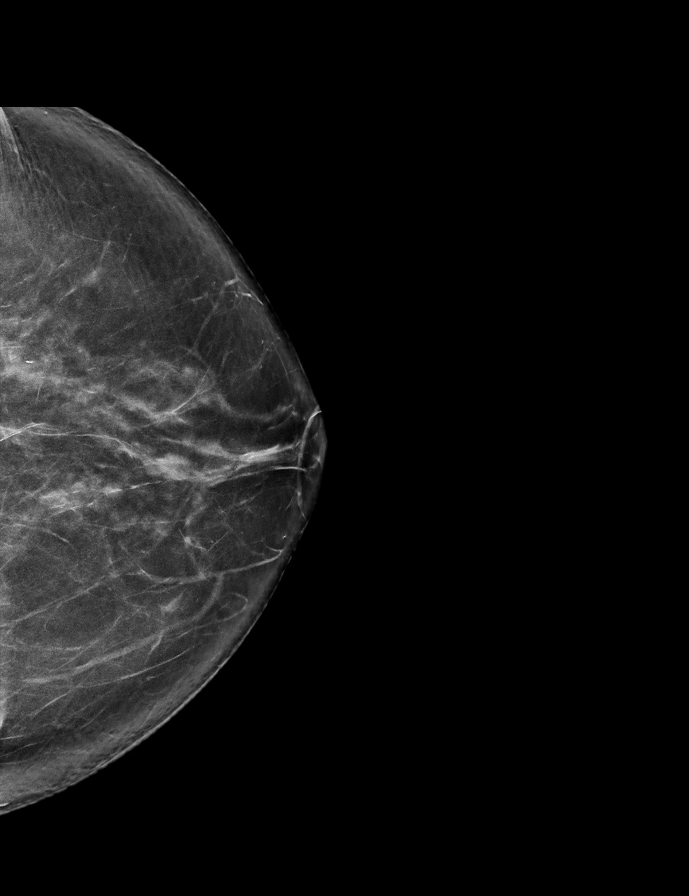

[R MLO]
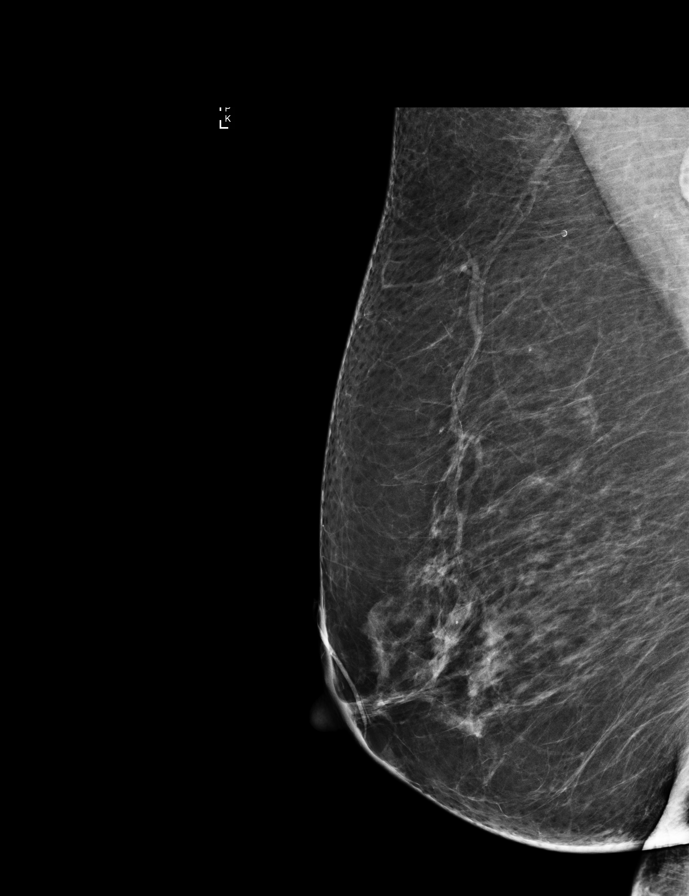

[L MLO synth-2D]
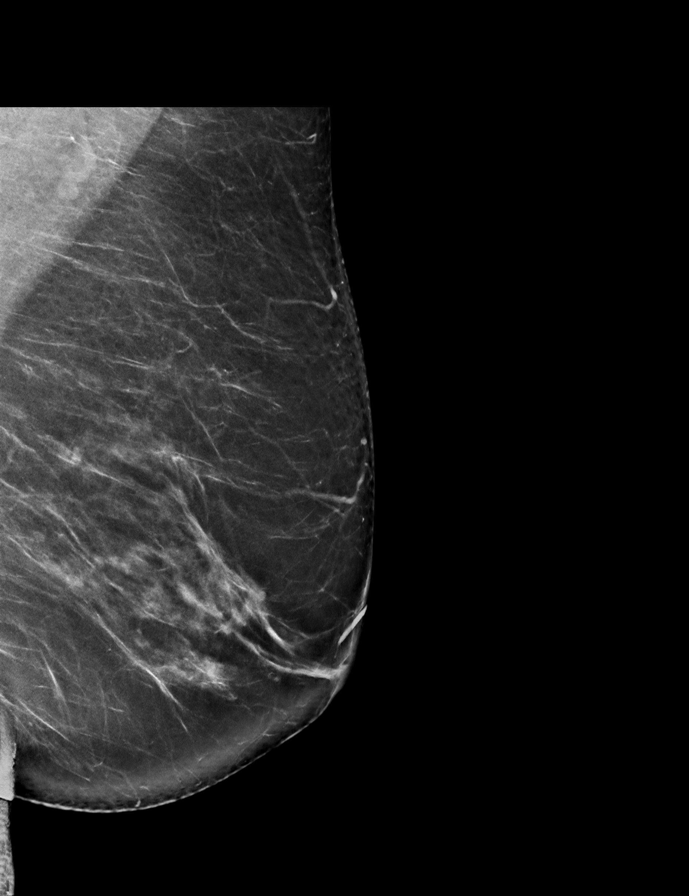

[R CC synth-2D]
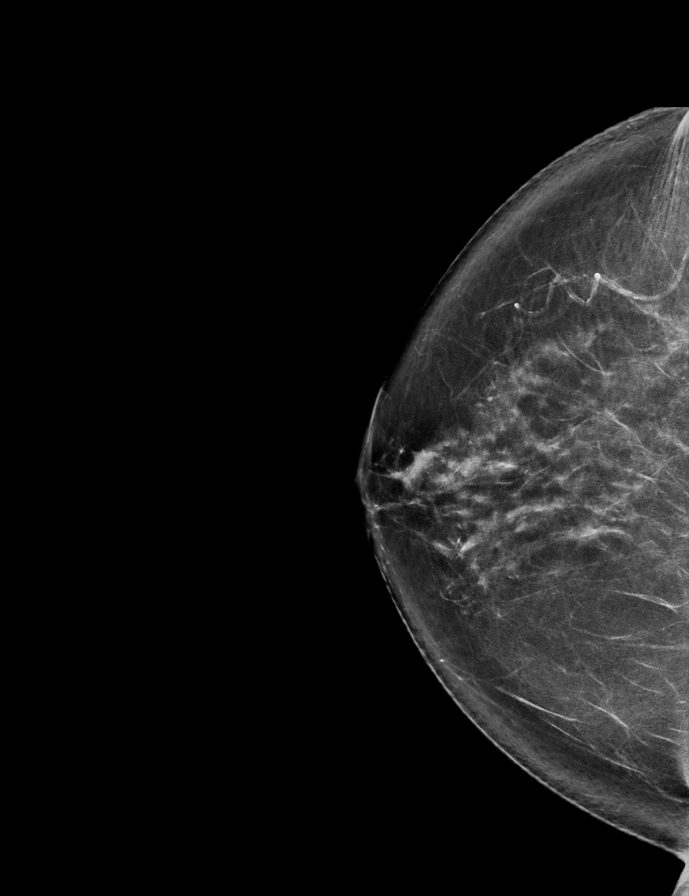

[R CC]
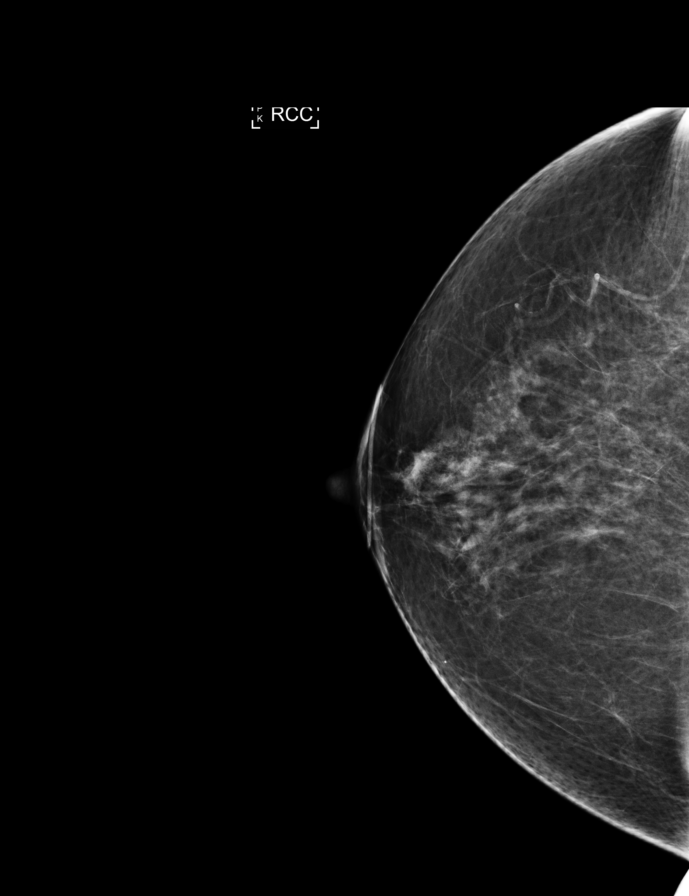

[L MLO]
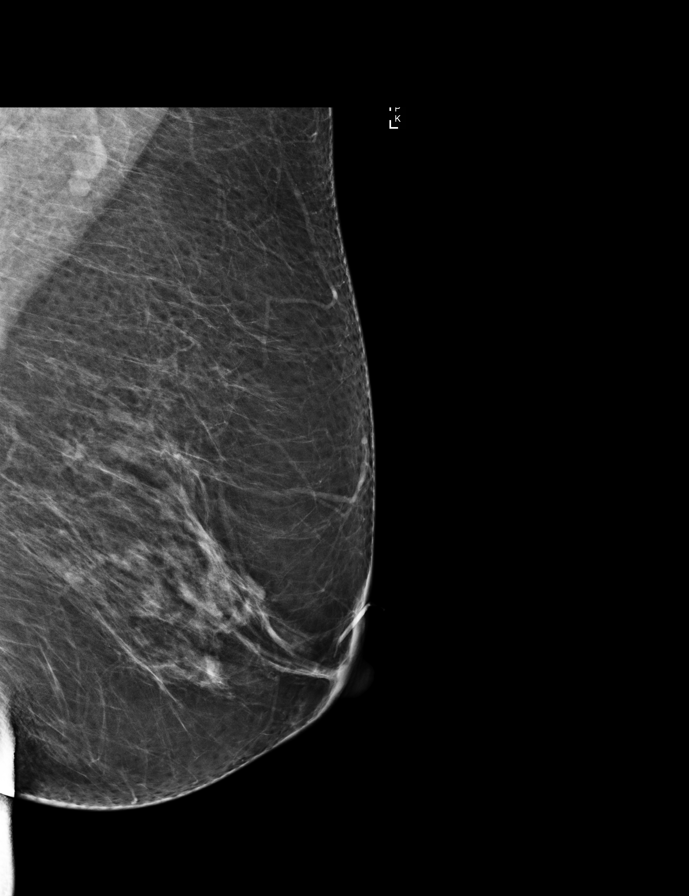

[L CC]
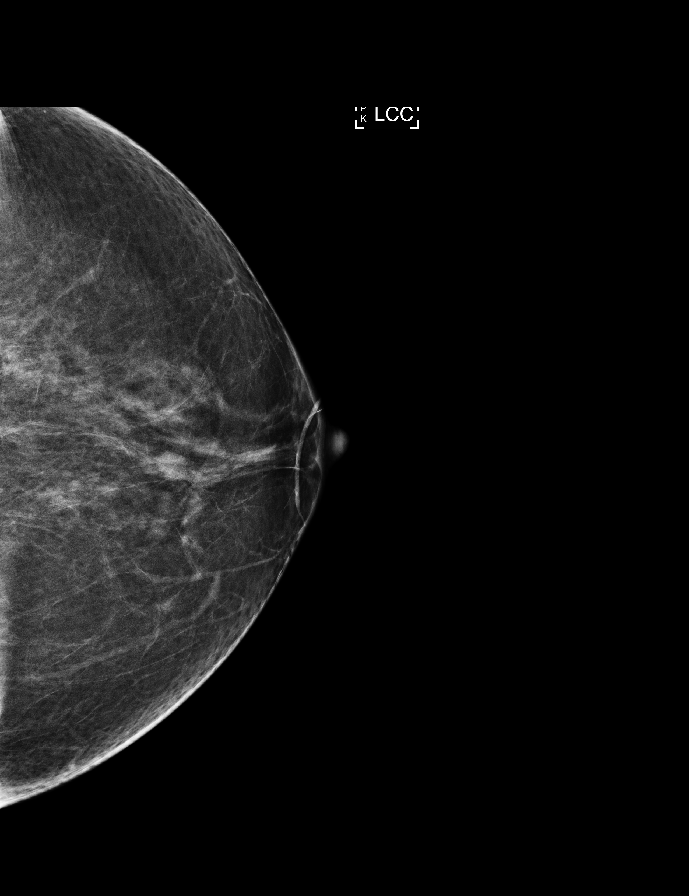

[L CC tomo · tomo slice 37/74.0]
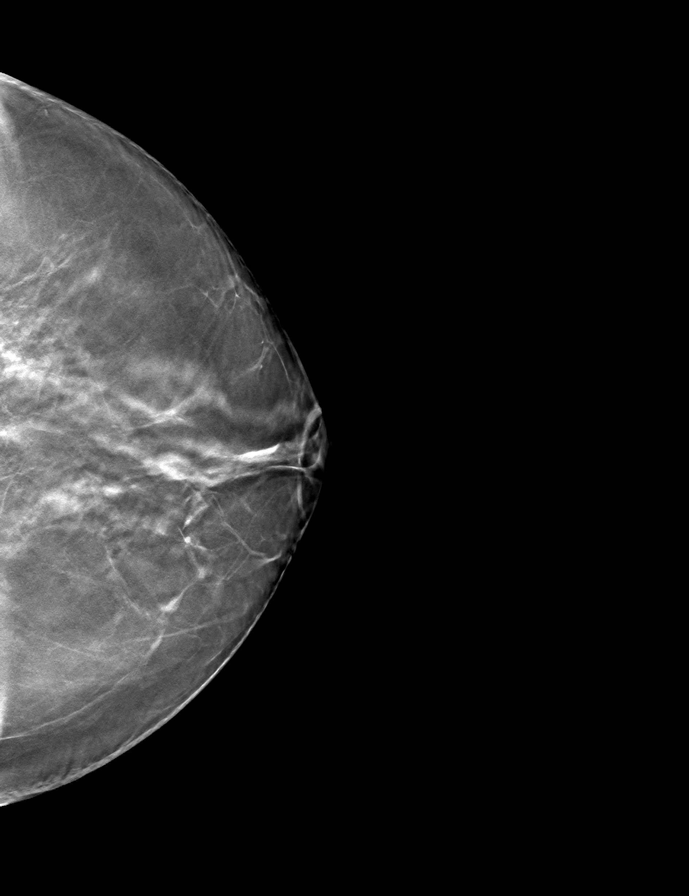

[9 of 28 positions shown; findings below may reference images not displayed]

ACR Breast Density Category b: There are scattered areas of
fibroglandular density.
FINDINGS: There are no findings suspicious for malignancy. Images were
processed with CAD.
IMPRESSION: No mammographic evidence of malignancy. A result letter of this
screening mammogram will be mailed directly to the patient.

RECOMMENDATION:
Screening mammogram in one year. (Code:[33])

BI-RADS CATEGORY  1: Negative.

## 2017-06-18 ENCOUNTER — Other Ambulatory Visit: Payer: Self-pay | Admitting: Family Medicine

## 2017-06-18 DIAGNOSIS — Z1231 Encounter for screening mammogram for malignant neoplasm of breast: Secondary | ICD-10-CM

## 2017-11-12 ENCOUNTER — Ambulatory Visit
Admission: RE | Admit: 2017-11-12 | Discharge: 2017-11-12 | Disposition: A | Discharge status: Home / Self Care | Payer: Medicare Other | Source: Ambulatory Visit | Attending: Family Medicine | Admitting: Family Medicine

## 2017-11-12 DIAGNOSIS — Z1231 Encounter for screening mammogram for malignant neoplasm of breast: Secondary | ICD-10-CM

## 2017-11-12 IMAGING — MG DIGITAL SCREENING BILATERAL MAMMOGRAM WITH TOMO AND CAD
6 of 10 series · 6 of 30 positions shown · non-contrast
Comparison: Previous exam(s).

CLINICAL DATA: Screening.

EXAM:
DIGITAL SCREENING BILATERAL MAMMOGRAM WITH TOMO AND CAD

[L MLO synth-2D]
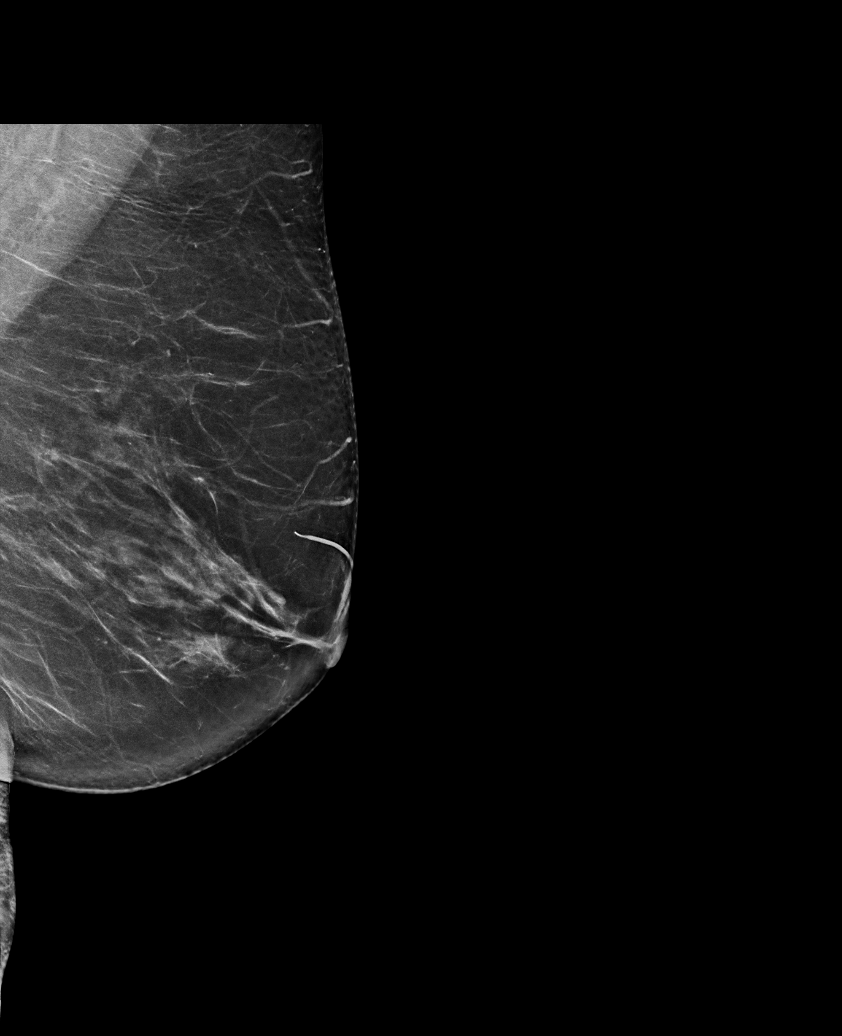

[L CC synth-2D]
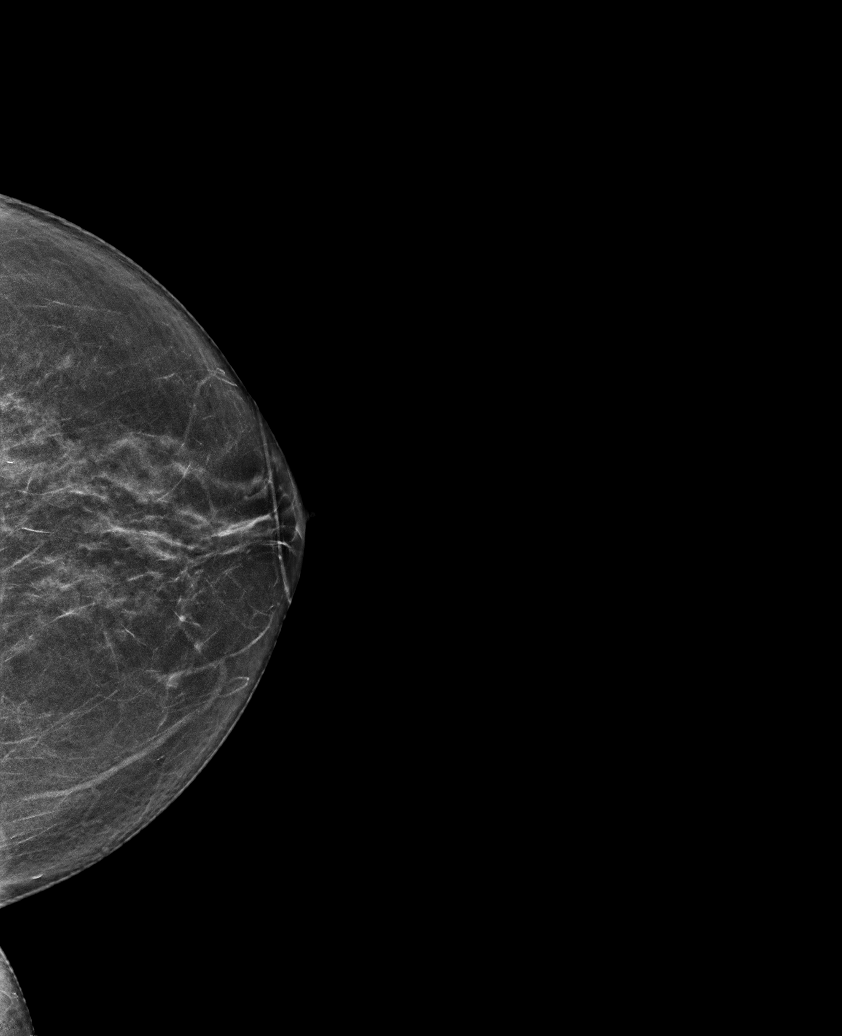

[R CC synth-2D (1 of 2)]
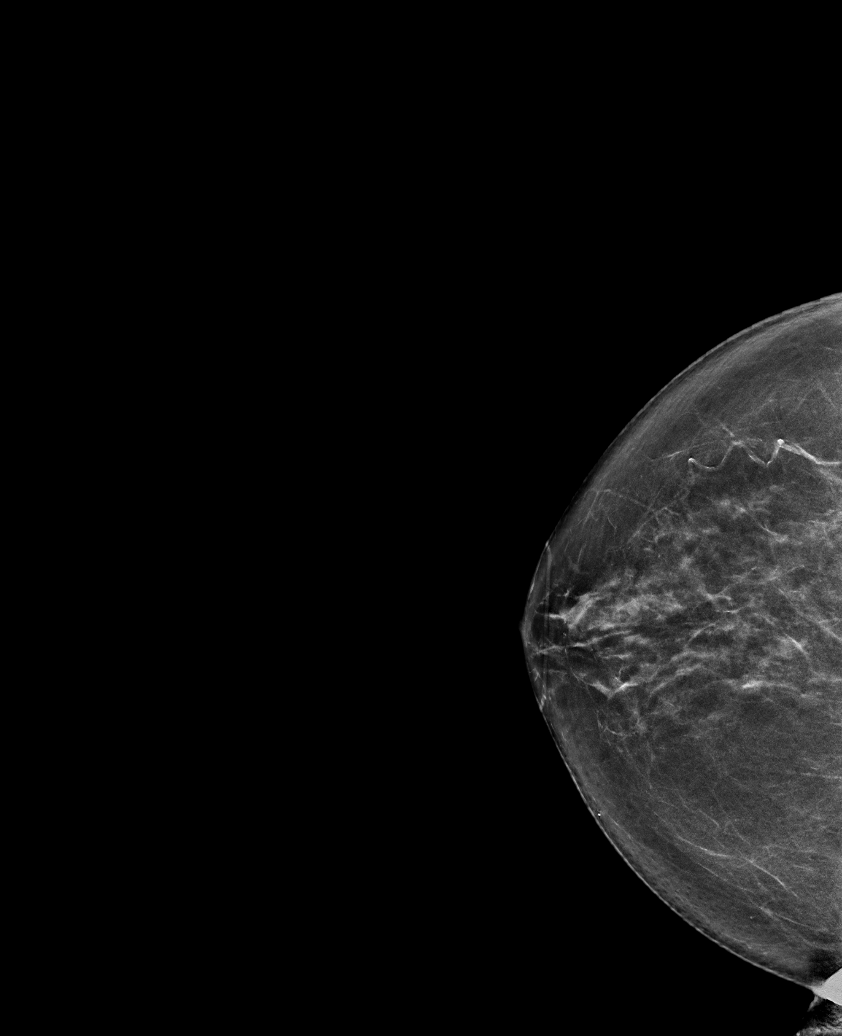

[R CC synth-2D (2 of 2)]
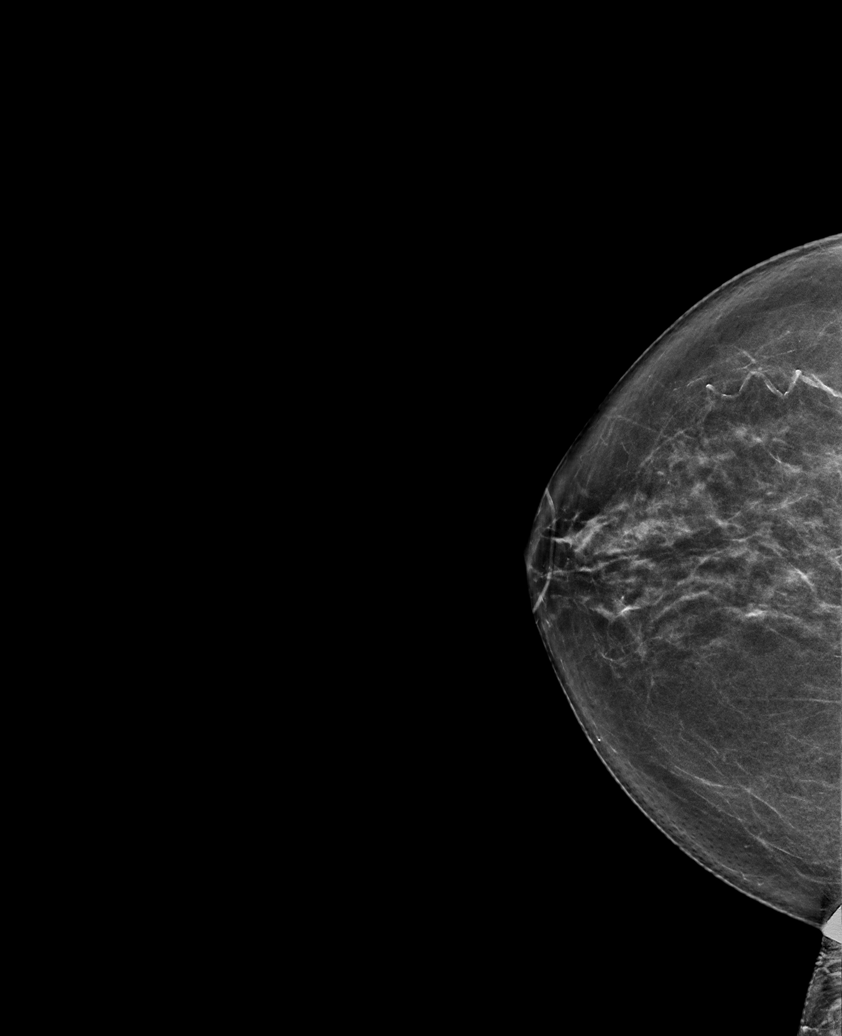

[R MLO synth-2D]
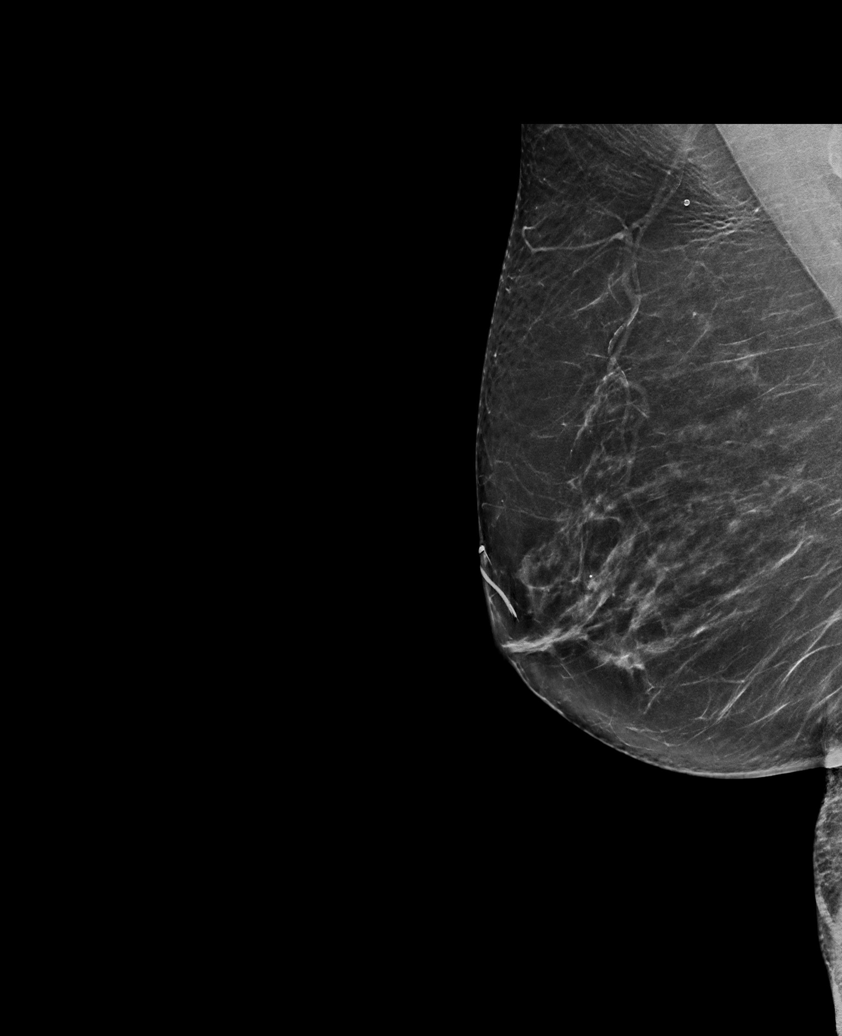

[R CC tomo · tomo slice 35/70.0]
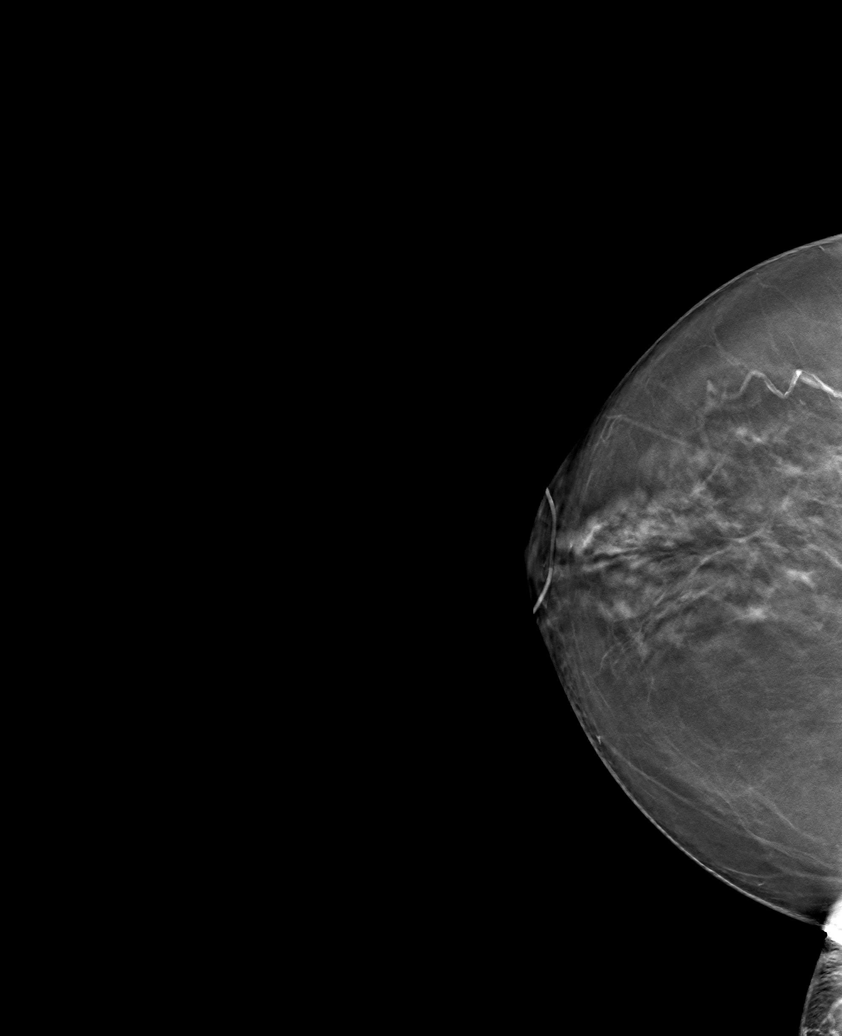

[6 of 30 positions shown; findings below may reference images not displayed]

ACR Breast Density Category c: The breast tissue is heterogeneously
dense, which may obscure small masses.
FINDINGS: There are no findings suspicious for malignancy. Images were
processed with CAD.
IMPRESSION: No mammographic evidence of malignancy. A result letter of this
screening mammogram will be mailed directly to the patient.

RECOMMENDATION:
Screening mammogram in one year. (Code:[5V])

BI-RADS CATEGORY  1: Negative.

## 2018-06-25 ENCOUNTER — Other Ambulatory Visit: Payer: Self-pay | Admitting: Family Medicine

## 2018-06-25 DIAGNOSIS — Z1231 Encounter for screening mammogram for malignant neoplasm of breast: Secondary | ICD-10-CM

## 2018-11-14 ENCOUNTER — Ambulatory Visit
Admission: RE | Admit: 2018-11-14 | Discharge: 2018-11-14 | Disposition: A | Discharge status: Home / Self Care | Payer: Medicare Other | Source: Ambulatory Visit | Attending: Family Medicine | Admitting: Family Medicine

## 2018-11-14 ENCOUNTER — Other Ambulatory Visit: Payer: Self-pay

## 2018-11-14 DIAGNOSIS — Z1231 Encounter for screening mammogram for malignant neoplasm of breast: Secondary | ICD-10-CM

## 2018-11-14 IMAGING — MG DIGITAL SCREENING BILAT W/ TOMO W/ CAD
8 series · 8 of 24 positions shown · non-contrast
Comparison: Previous exam(s).

CLINICAL DATA: Screening.

EXAM:
DIGITAL SCREENING BILATERAL MAMMOGRAM WITH TOMO AND CAD

[R CC synth-2D]
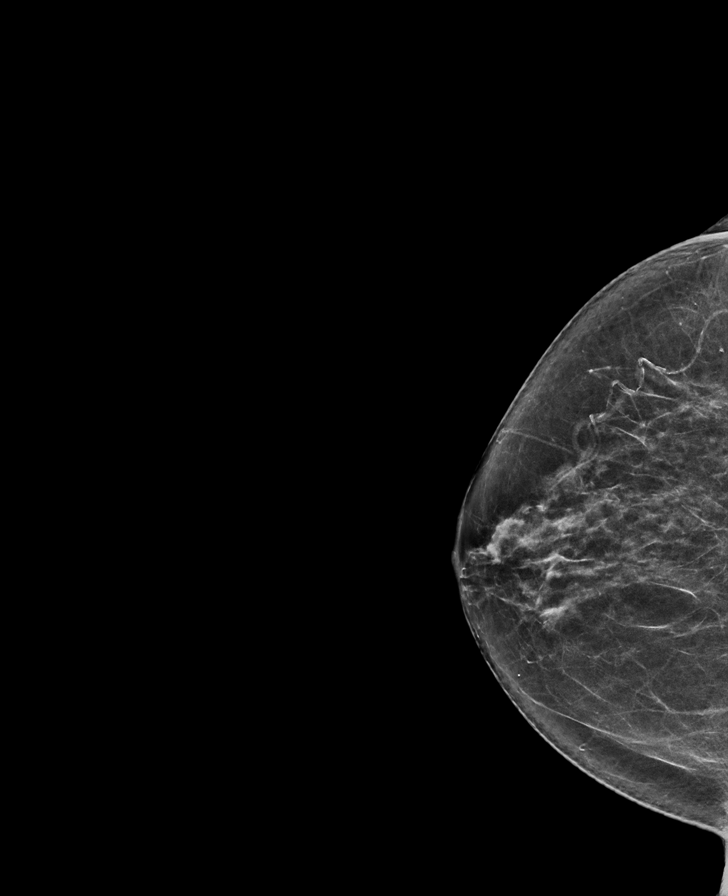

[R MLO synth-2D]
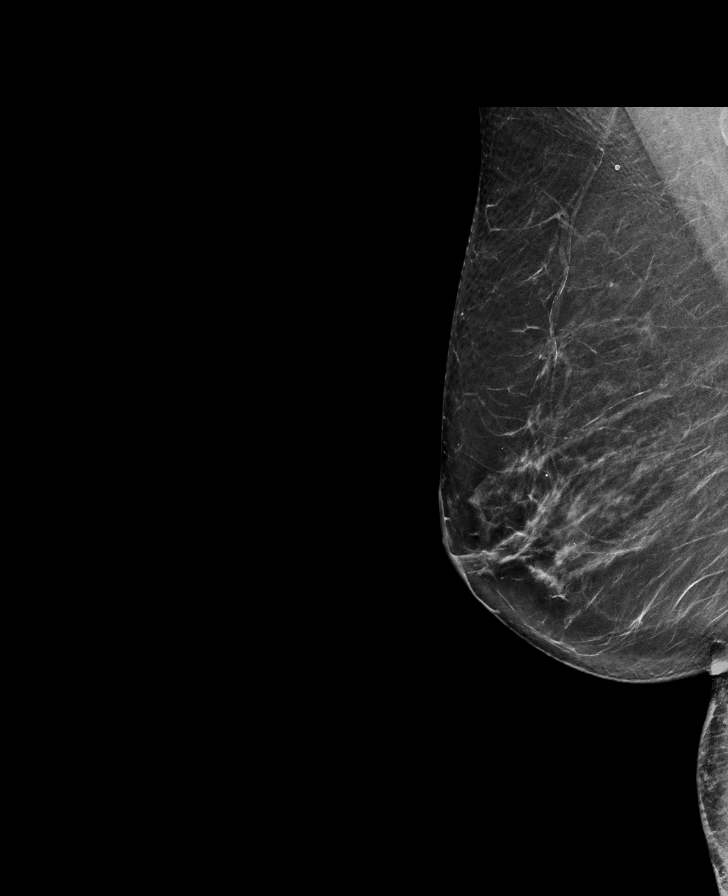

[L MLO synth-2D]
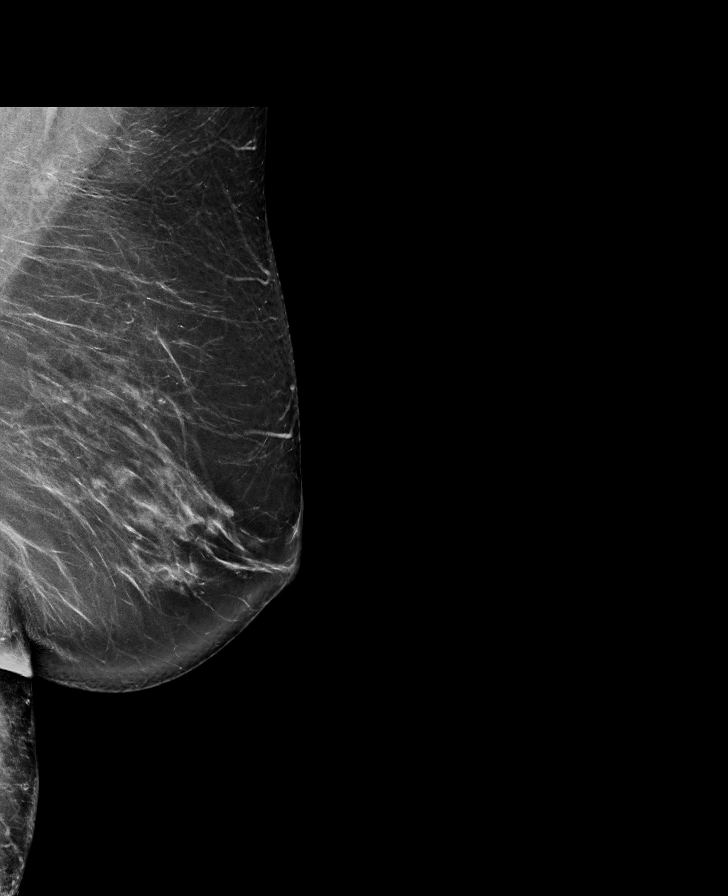

[L CC synth-2D]
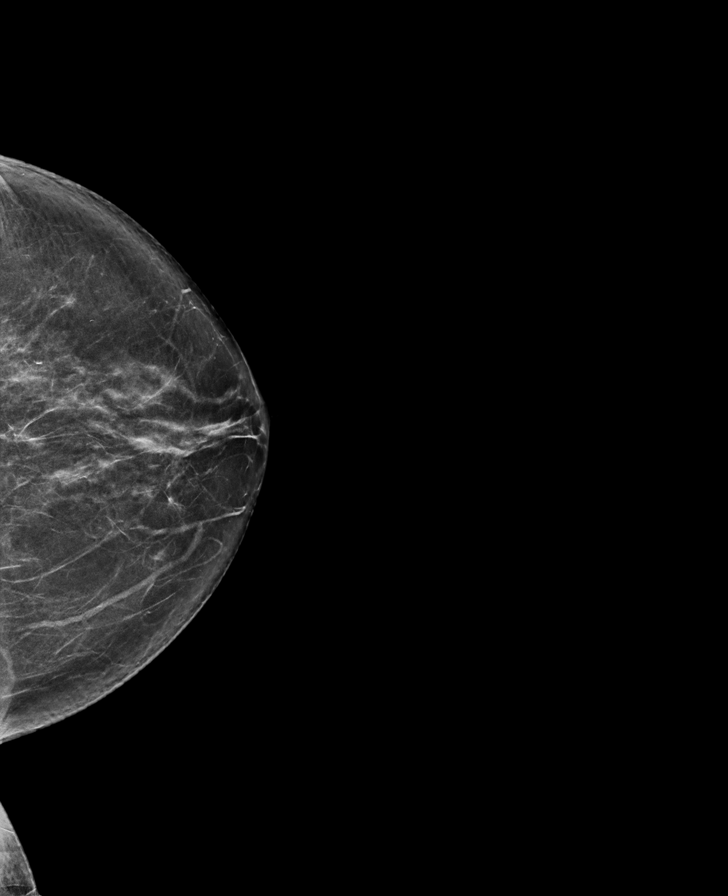

[R CC tomo · tomo slice 35/69.0]
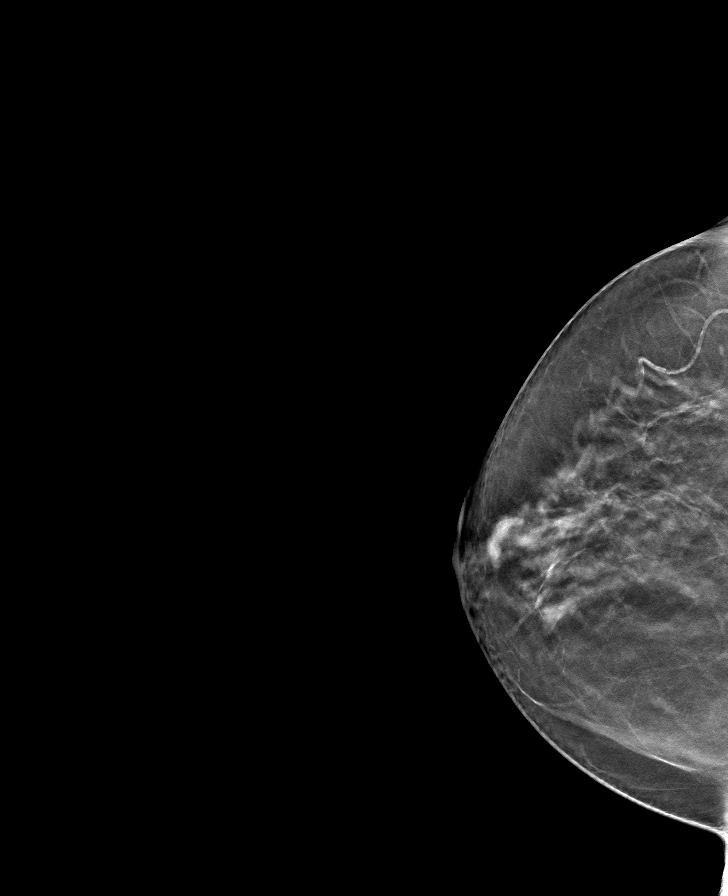

[L MLO tomo · tomo slice 42/83.0]
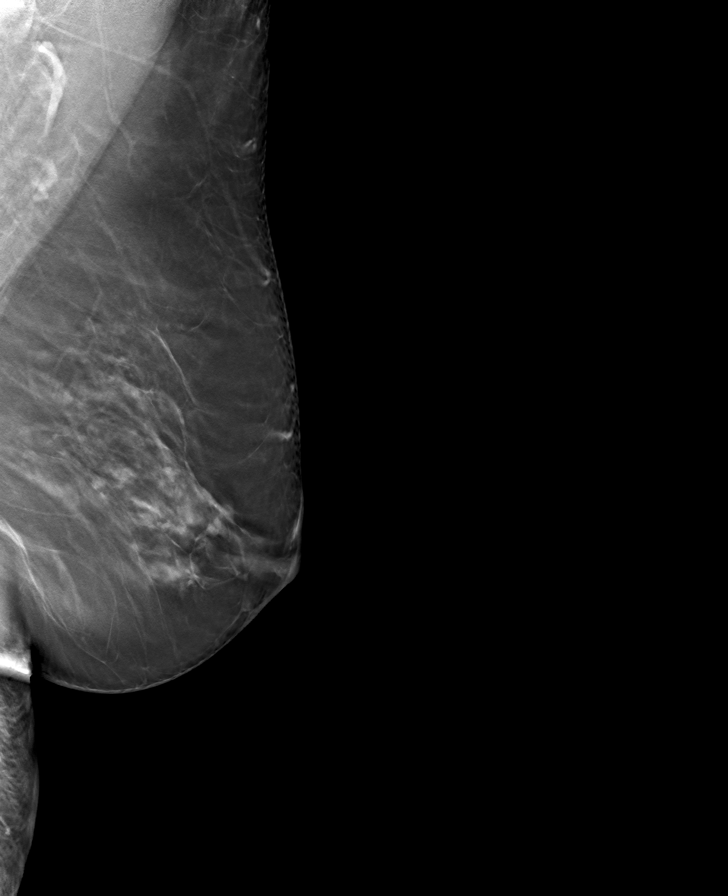

[L CC tomo · tomo slice 35/69.0]
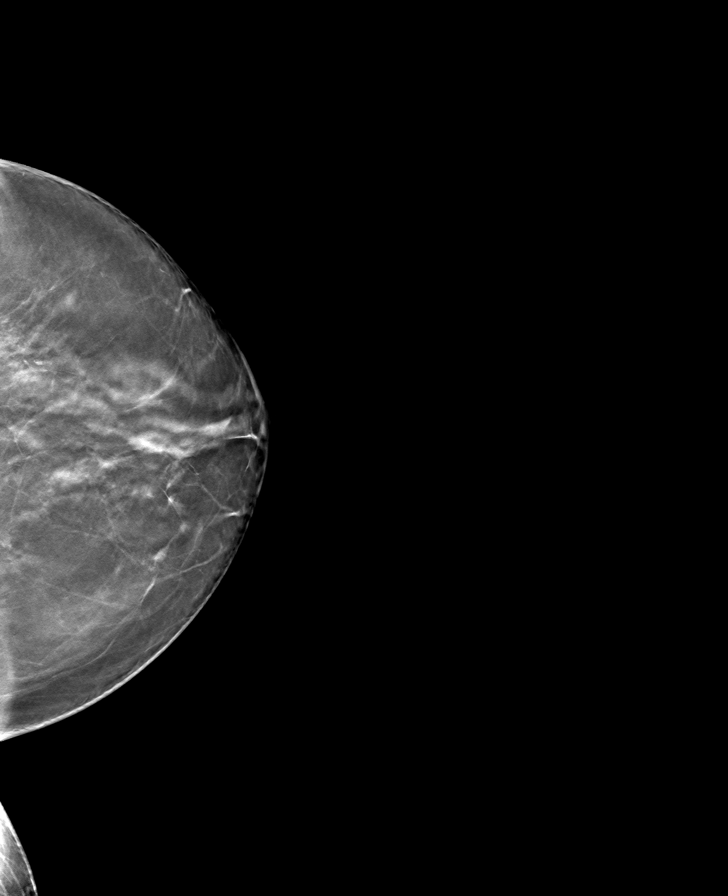

[R MLO tomo · tomo slice 38/75.0]
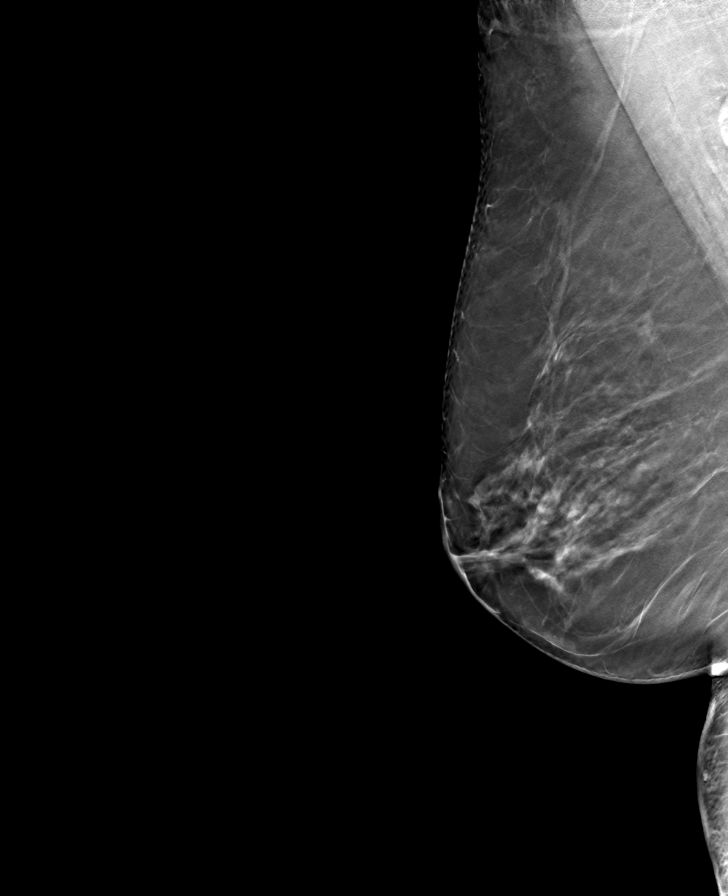

[8 of 24 positions shown; findings below may reference images not displayed]

ACR Breast Density Category c: The breast tissue is heterogeneously
dense, which may obscure small masses.
FINDINGS: There are no findings suspicious for malignancy. Images were
processed with CAD.
IMPRESSION: No mammographic evidence of malignancy. A result letter of this
screening mammogram will be mailed directly to the patient.

RECOMMENDATION:
Screening mammogram in one year. (Code:[5V])

BI-RADS CATEGORY  1: Negative.

## 2019-03-10 ENCOUNTER — Ambulatory Visit: Payer: Medicare PPO

## 2019-03-29 ENCOUNTER — Ambulatory Visit: Payer: Medicare Other

## 2019-08-02 DIAGNOSIS — M545 Low back pain: Secondary | ICD-10-CM | POA: Diagnosis not present

## 2019-08-26 ENCOUNTER — Other Ambulatory Visit: Payer: Self-pay | Admitting: Family Medicine

## 2019-08-26 DIAGNOSIS — Z1231 Encounter for screening mammogram for malignant neoplasm of breast: Secondary | ICD-10-CM

## 2019-09-30 DIAGNOSIS — E1169 Type 2 diabetes mellitus with other specified complication: Secondary | ICD-10-CM | POA: Diagnosis not present

## 2019-09-30 DIAGNOSIS — Z7984 Long term (current) use of oral hypoglycemic drugs: Secondary | ICD-10-CM | POA: Diagnosis not present

## 2019-10-16 DIAGNOSIS — Z23 Encounter for immunization: Secondary | ICD-10-CM | POA: Diagnosis not present

## 2019-10-16 DIAGNOSIS — Z7984 Long term (current) use of oral hypoglycemic drugs: Secondary | ICD-10-CM | POA: Diagnosis not present

## 2019-10-16 DIAGNOSIS — E1169 Type 2 diabetes mellitus with other specified complication: Secondary | ICD-10-CM | POA: Diagnosis not present

## 2019-11-17 ENCOUNTER — Ambulatory Visit
Admission: RE | Admit: 2019-11-17 | Discharge: 2019-11-17 | Disposition: A | Discharge status: Home / Self Care | Payer: Medicare PPO | Source: Ambulatory Visit | Attending: Family Medicine | Admitting: Family Medicine

## 2019-11-17 ENCOUNTER — Other Ambulatory Visit: Payer: Self-pay

## 2019-11-17 DIAGNOSIS — Z1231 Encounter for screening mammogram for malignant neoplasm of breast: Secondary | ICD-10-CM | POA: Diagnosis not present

## 2019-11-17 IMAGING — MG DIGITAL SCREENING BILAT W/ TOMO W/ CAD
8 series · 9 of 24 positions shown · non-contrast
Comparison: Previous exam(s).

CLINICAL DATA: Screening.

EXAM:
DIGITAL SCREENING BILATERAL MAMMOGRAM WITH TOMO AND CAD

[R CC synth-2D]
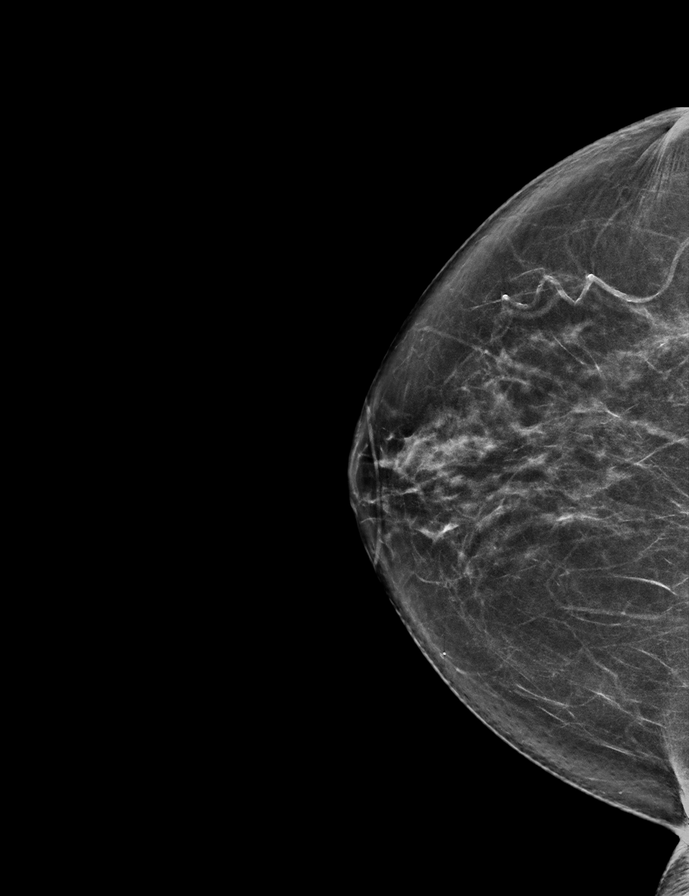

[L CC synth-2D]
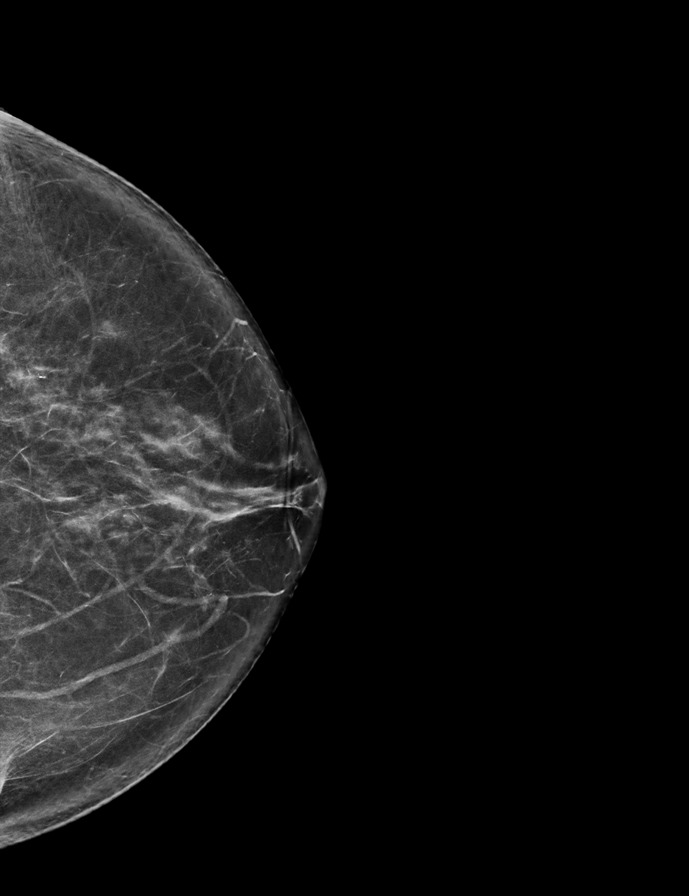

[L MLO synth-2D]
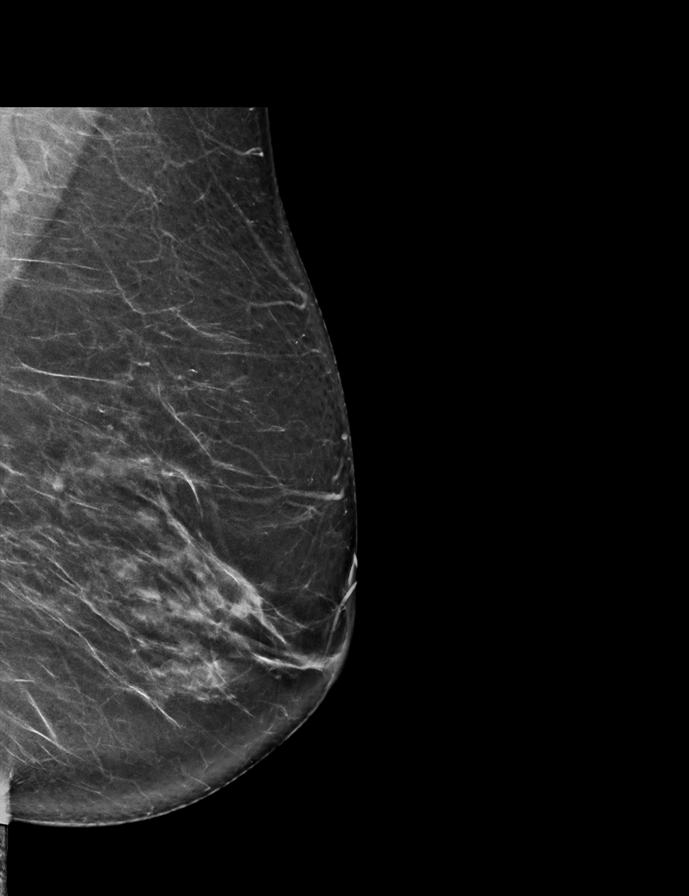

[R MLO synth-2D]
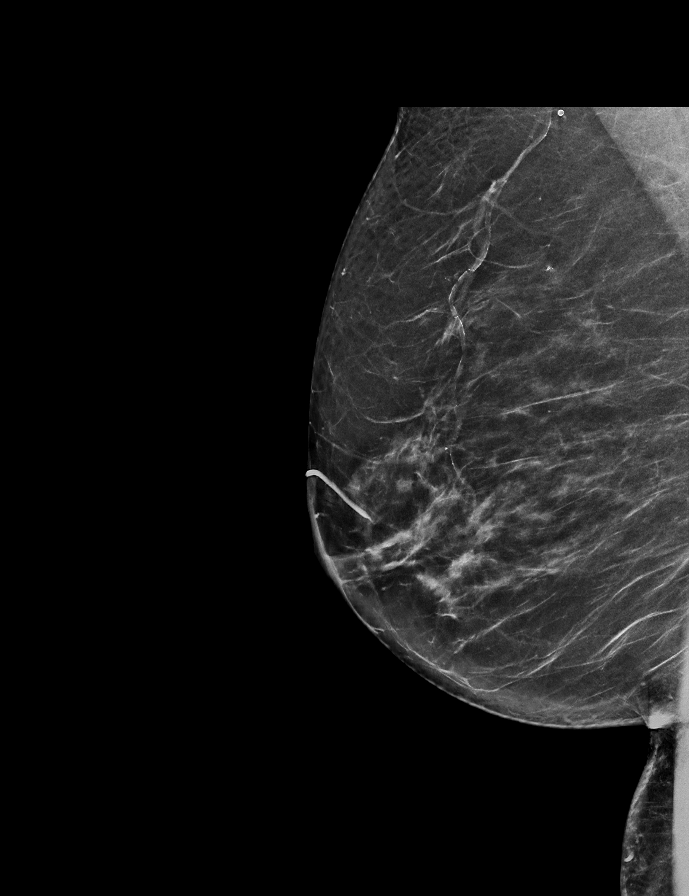

[L CC tomo · 2 of 69 frames shown]
[frame 23/69]
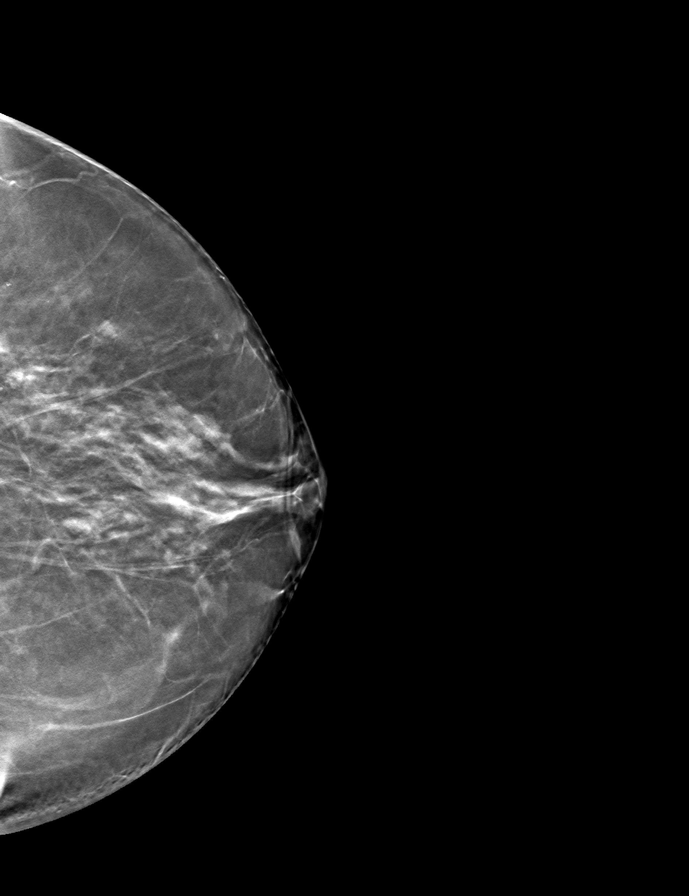
[frame 35/69]
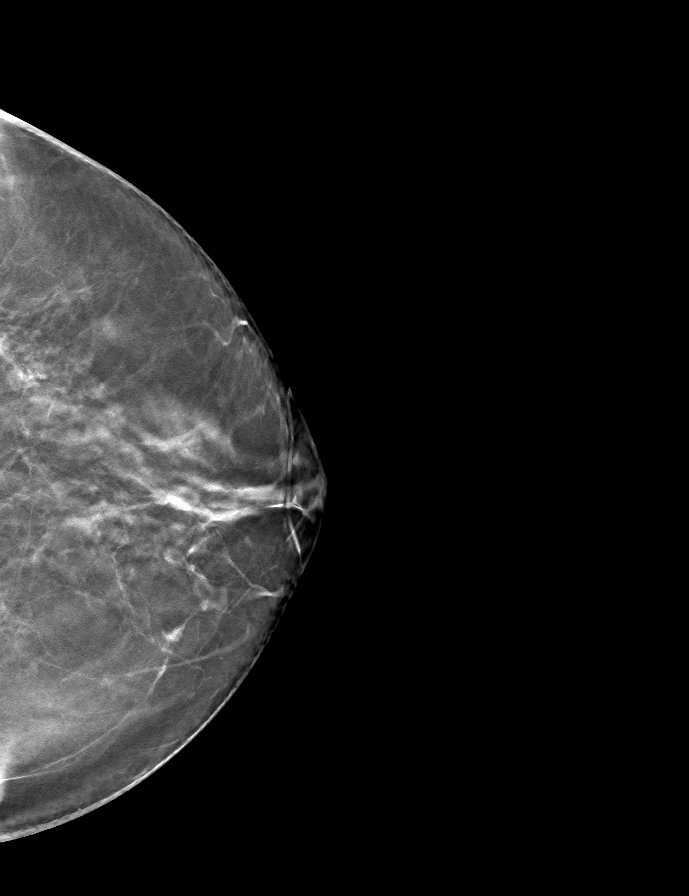

[L MLO tomo · tomo slice 37/72.0]
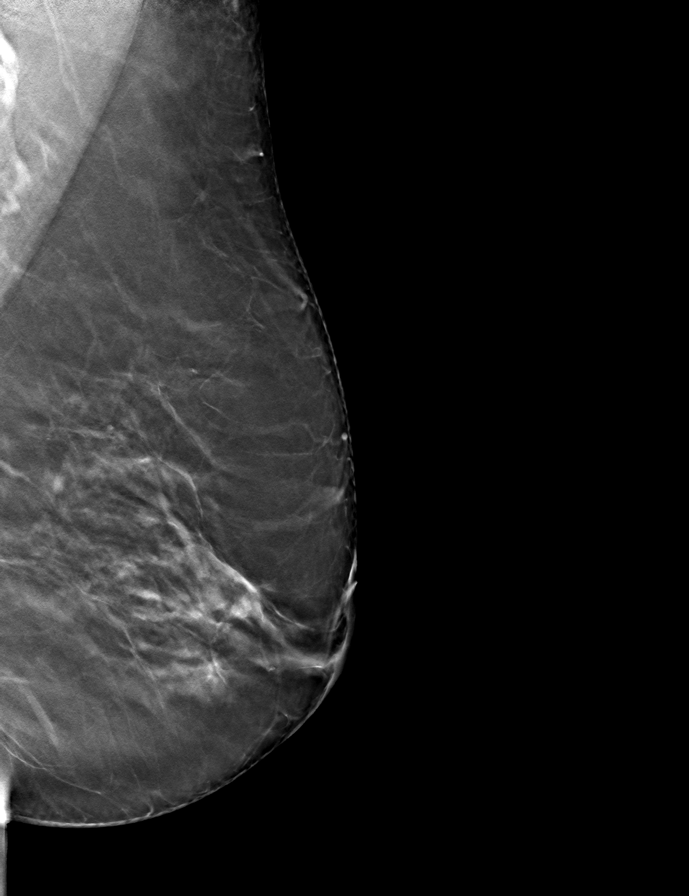

[R MLO tomo · tomo slice 35/69.0]
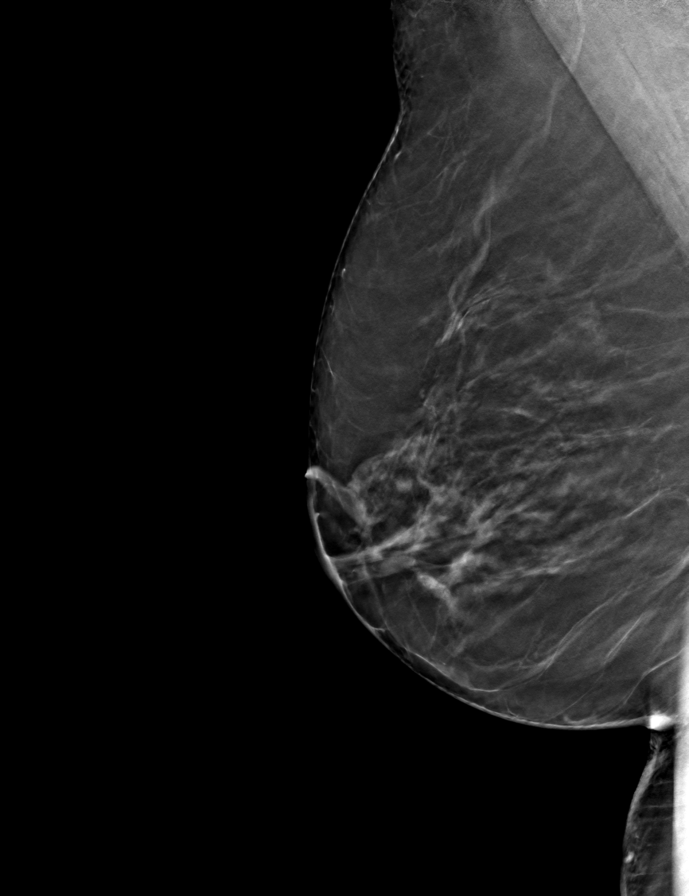

[R CC tomo · tomo slice 35/68.0]
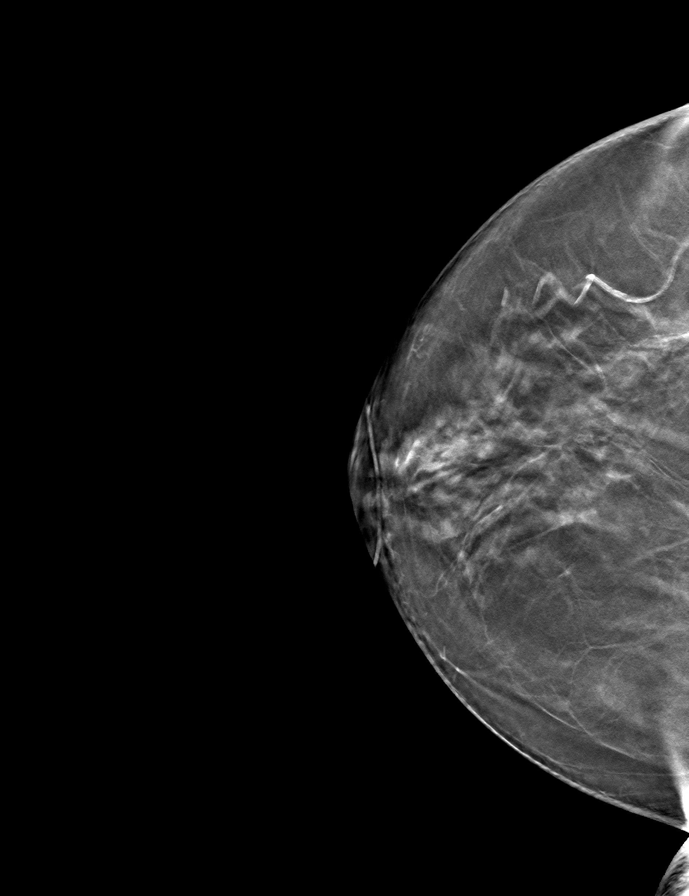

[9 of 24 positions shown; findings below may reference images not displayed]

ACR Breast Density Category c: The breast tissue is heterogeneously
dense, which may obscure small masses.
FINDINGS: There are no findings suspicious for malignancy. Images were
processed with CAD.
IMPRESSION: No mammographic evidence of malignancy. A result letter of this
screening mammogram will be mailed directly to the patient.

RECOMMENDATION:
Screening mammogram in one year. (Code:[5V])

BI-RADS CATEGORY  1: Negative.

## 2019-11-25 DIAGNOSIS — T1512XA Foreign body in conjunctival sac, left eye, initial encounter: Secondary | ICD-10-CM | POA: Diagnosis not present

## 2019-11-25 DIAGNOSIS — H1132 Conjunctival hemorrhage, left eye: Secondary | ICD-10-CM | POA: Diagnosis not present

## 2019-11-27 DIAGNOSIS — Z794 Long term (current) use of insulin: Secondary | ICD-10-CM | POA: Diagnosis not present

## 2019-11-27 DIAGNOSIS — E1169 Type 2 diabetes mellitus with other specified complication: Secondary | ICD-10-CM | POA: Diagnosis not present

## 2019-11-29 ENCOUNTER — Ambulatory Visit: Payer: Medicare PPO | Attending: Internal Medicine

## 2019-11-29 DIAGNOSIS — Z23 Encounter for immunization: Secondary | ICD-10-CM

## 2019-11-29 NOTE — Progress Notes (Signed)
   Covid-[facility] Vaccination [facility]  Name:  Madison Wilson    MRN: [id number] DOB: May 06, 1945  11/29/2019  Madison Wilson was observed post Covid-19 immunization for 15 minutes without incident. She was provided with Vaccine Information Sheet and instruction to access the V-Safe system.   Madison Wilson was instructed to call 911 with any severe reactions post vaccine: Marland Kitchen Difficulty breathing  . Swelling of face and throat  . A fast heartbeat  . A bad rash all over body  . Dizziness and weakness

## 2019-12-24 DIAGNOSIS — R059 Cough, unspecified: Secondary | ICD-10-CM | POA: Diagnosis not present

## 2019-12-24 DIAGNOSIS — Z03818 Encounter for observation for suspected exposure to other biological agents ruled out: Secondary | ICD-10-CM | POA: Diagnosis not present

## 2019-12-28 DIAGNOSIS — Z03818 Encounter for observation for suspected exposure to other biological agents ruled out: Secondary | ICD-10-CM | POA: Diagnosis not present

## 2019-12-28 DIAGNOSIS — J209 Acute bronchitis, unspecified: Secondary | ICD-10-CM | POA: Diagnosis not present

## 2019-12-28 DIAGNOSIS — R059 Cough, unspecified: Secondary | ICD-10-CM | POA: Diagnosis not present

## 2020-01-12 DIAGNOSIS — I10 Essential (primary) hypertension: Secondary | ICD-10-CM | POA: Diagnosis not present

## 2020-01-12 DIAGNOSIS — E1165 Type 2 diabetes mellitus with hyperglycemia: Secondary | ICD-10-CM | POA: Diagnosis not present

## 2020-01-12 DIAGNOSIS — E119 Type 2 diabetes mellitus without complications: Secondary | ICD-10-CM | POA: Diagnosis not present

## 2020-01-12 DIAGNOSIS — Z6826 Body mass index (BMI) 26.0-26.9, adult: Secondary | ICD-10-CM | POA: Diagnosis not present

## 2020-01-12 DIAGNOSIS — E785 Hyperlipidemia, unspecified: Secondary | ICD-10-CM | POA: Diagnosis not present

## 2020-02-18 DIAGNOSIS — E785 Hyperlipidemia, unspecified: Secondary | ICD-10-CM | POA: Diagnosis not present

## 2020-02-18 DIAGNOSIS — I10 Essential (primary) hypertension: Secondary | ICD-10-CM | POA: Diagnosis not present

## 2020-02-18 DIAGNOSIS — E1165 Type 2 diabetes mellitus with hyperglycemia: Secondary | ICD-10-CM | POA: Diagnosis not present

## 2020-03-08 DIAGNOSIS — L308 Other specified dermatitis: Secondary | ICD-10-CM | POA: Diagnosis not present

## 2020-03-08 DIAGNOSIS — L821 Other seborrheic keratosis: Secondary | ICD-10-CM | POA: Diagnosis not present

## 2020-03-08 DIAGNOSIS — D225 Melanocytic nevi of trunk: Secondary | ICD-10-CM | POA: Diagnosis not present

## 2020-03-08 DIAGNOSIS — C44319 Basal cell carcinoma of skin of other parts of face: Secondary | ICD-10-CM | POA: Diagnosis not present

## 2020-03-08 DIAGNOSIS — D2261 Melanocytic nevi of right upper limb, including shoulder: Secondary | ICD-10-CM | POA: Diagnosis not present

## 2020-03-08 DIAGNOSIS — D1801 Hemangioma of skin and subcutaneous tissue: Secondary | ICD-10-CM | POA: Diagnosis not present

## 2020-03-30 DIAGNOSIS — L900 Lichen sclerosus et atrophicus: Secondary | ICD-10-CM | POA: Diagnosis not present

## 2020-03-30 DIAGNOSIS — Z01411 Encounter for gynecological examination (general) (routine) with abnormal findings: Secondary | ICD-10-CM | POA: Diagnosis not present

## 2020-03-30 DIAGNOSIS — N952 Postmenopausal atrophic vaginitis: Secondary | ICD-10-CM | POA: Diagnosis not present

## 2020-03-31 DIAGNOSIS — Z85828 Personal history of other malignant neoplasm of skin: Secondary | ICD-10-CM | POA: Diagnosis not present

## 2020-03-31 DIAGNOSIS — C44319 Basal cell carcinoma of skin of other parts of face: Secondary | ICD-10-CM | POA: Diagnosis not present

## 2020-04-05 DIAGNOSIS — E119 Type 2 diabetes mellitus without complications: Secondary | ICD-10-CM | POA: Diagnosis not present

## 2020-04-05 DIAGNOSIS — E1165 Type 2 diabetes mellitus with hyperglycemia: Secondary | ICD-10-CM | POA: Diagnosis not present

## 2020-04-12 DIAGNOSIS — F419 Anxiety disorder, unspecified: Secondary | ICD-10-CM | POA: Diagnosis not present

## 2020-04-12 DIAGNOSIS — E2839 Other primary ovarian failure: Secondary | ICD-10-CM | POA: Diagnosis not present

## 2020-04-12 DIAGNOSIS — Z0000 Encounter for general adult medical examination without abnormal findings: Secondary | ICD-10-CM | POA: Diagnosis not present

## 2020-04-12 DIAGNOSIS — E7800 Pure hypercholesterolemia, unspecified: Secondary | ICD-10-CM | POA: Diagnosis not present

## 2020-04-12 DIAGNOSIS — Z1389 Encounter for screening for other disorder: Secondary | ICD-10-CM | POA: Diagnosis not present

## 2020-04-12 DIAGNOSIS — F5101 Primary insomnia: Secondary | ICD-10-CM | POA: Diagnosis not present

## 2020-04-12 DIAGNOSIS — E1169 Type 2 diabetes mellitus with other specified complication: Secondary | ICD-10-CM | POA: Diagnosis not present

## 2020-04-12 DIAGNOSIS — K219 Gastro-esophageal reflux disease without esophagitis: Secondary | ICD-10-CM | POA: Diagnosis not present

## 2020-04-12 DIAGNOSIS — I10 Essential (primary) hypertension: Secondary | ICD-10-CM | POA: Diagnosis not present

## 2020-04-19 DIAGNOSIS — E785 Hyperlipidemia, unspecified: Secondary | ICD-10-CM | POA: Diagnosis not present

## 2020-04-19 DIAGNOSIS — I10 Essential (primary) hypertension: Secondary | ICD-10-CM | POA: Diagnosis not present

## 2020-04-19 DIAGNOSIS — E139 Other specified diabetes mellitus without complications: Secondary | ICD-10-CM | POA: Diagnosis not present

## 2020-04-19 DIAGNOSIS — E119 Type 2 diabetes mellitus without complications: Secondary | ICD-10-CM | POA: Diagnosis not present

## 2020-04-19 DIAGNOSIS — Z6826 Body mass index (BMI) 26.0-26.9, adult: Secondary | ICD-10-CM | POA: Diagnosis not present

## 2020-05-03 DIAGNOSIS — M85852 Other specified disorders of bone density and structure, left thigh: Secondary | ICD-10-CM | POA: Diagnosis not present

## 2020-05-03 DIAGNOSIS — M85851 Other specified disorders of bone density and structure, right thigh: Secondary | ICD-10-CM | POA: Diagnosis not present

## 2020-05-03 DIAGNOSIS — Z780 Asymptomatic menopausal state: Secondary | ICD-10-CM | POA: Diagnosis not present

## 2020-07-28 DIAGNOSIS — H26492 Other secondary cataract, left eye: Secondary | ICD-10-CM | POA: Diagnosis not present

## 2020-07-28 DIAGNOSIS — H524 Presbyopia: Secondary | ICD-10-CM | POA: Diagnosis not present

## 2020-07-28 DIAGNOSIS — E119 Type 2 diabetes mellitus without complications: Secondary | ICD-10-CM | POA: Diagnosis not present

## 2020-07-28 DIAGNOSIS — H43813 Vitreous degeneration, bilateral: Secondary | ICD-10-CM | POA: Diagnosis not present

## 2020-10-15 ENCOUNTER — Other Ambulatory Visit: Payer: Self-pay | Admitting: Family Medicine

## 2020-10-15 DIAGNOSIS — Z1231 Encounter for screening mammogram for malignant neoplasm of breast: Secondary | ICD-10-CM

## 2020-10-26 DIAGNOSIS — I10 Essential (primary) hypertension: Secondary | ICD-10-CM | POA: Diagnosis not present

## 2020-10-26 DIAGNOSIS — G2581 Restless legs syndrome: Secondary | ICD-10-CM | POA: Diagnosis not present

## 2020-10-26 DIAGNOSIS — E7800 Pure hypercholesterolemia, unspecified: Secondary | ICD-10-CM | POA: Diagnosis not present

## 2020-10-26 DIAGNOSIS — Z23 Encounter for immunization: Secondary | ICD-10-CM | POA: Diagnosis not present

## 2020-10-26 DIAGNOSIS — E1169 Type 2 diabetes mellitus with other specified complication: Secondary | ICD-10-CM | POA: Diagnosis not present

## 2020-10-26 DIAGNOSIS — Z7984 Long term (current) use of oral hypoglycemic drugs: Secondary | ICD-10-CM | POA: Diagnosis not present

## 2020-10-26 DIAGNOSIS — F419 Anxiety disorder, unspecified: Secondary | ICD-10-CM | POA: Diagnosis not present

## 2020-11-14 DIAGNOSIS — R519 Headache, unspecified: Secondary | ICD-10-CM | POA: Diagnosis not present

## 2020-11-14 DIAGNOSIS — I10 Essential (primary) hypertension: Secondary | ICD-10-CM | POA: Diagnosis not present

## 2020-11-15 DIAGNOSIS — R519 Headache, unspecified: Secondary | ICD-10-CM | POA: Diagnosis not present

## 2020-11-15 DIAGNOSIS — I10 Essential (primary) hypertension: Secondary | ICD-10-CM | POA: Diagnosis not present

## 2020-11-17 DIAGNOSIS — G4452 New daily persistent headache (NDPH): Secondary | ICD-10-CM | POA: Diagnosis not present

## 2020-11-19 ENCOUNTER — Ambulatory Visit: Payer: Medicare PPO

## 2020-11-22 ENCOUNTER — Emergency Department (HOSPITAL_BASED_OUTPATIENT_CLINIC_OR_DEPARTMENT_OTHER)
Admission: EM | Admit: 2020-11-22 | Discharge: 2020-11-22 | Disposition: A | Discharge status: Home / Self Care | Payer: Medicare PPO | Attending: Student | Admitting: Student

## 2020-11-22 ENCOUNTER — Encounter (HOSPITAL_BASED_OUTPATIENT_CLINIC_OR_DEPARTMENT_OTHER): Payer: Self-pay | Admitting: Emergency Medicine

## 2020-11-22 ENCOUNTER — Other Ambulatory Visit: Payer: Self-pay

## 2020-11-22 ENCOUNTER — Emergency Department (HOSPITAL_BASED_OUTPATIENT_CLINIC_OR_DEPARTMENT_OTHER): Payer: Medicare PPO

## 2020-11-22 DIAGNOSIS — Z79899 Other long term (current) drug therapy: Secondary | ICD-10-CM | POA: Insufficient documentation

## 2020-11-22 DIAGNOSIS — E876 Hypokalemia: Secondary | ICD-10-CM | POA: Insufficient documentation

## 2020-11-22 DIAGNOSIS — E119 Type 2 diabetes mellitus without complications: Secondary | ICD-10-CM | POA: Insufficient documentation

## 2020-11-22 DIAGNOSIS — R519 Headache, unspecified: Secondary | ICD-10-CM | POA: Diagnosis not present

## 2020-11-22 DIAGNOSIS — I10 Essential (primary) hypertension: Secondary | ICD-10-CM | POA: Diagnosis not present

## 2020-11-22 HISTORY — DX: Essential (primary) hypertension: I10

## 2020-11-22 LAB — CBC WITH DIFFERENTIAL/PLATELET
Abs Immature Granulocytes: 0.02 10*3/uL (ref 0.00–0.07)
Basophils Absolute: 0 10*3/uL (ref 0.0–0.1)
Basophils Relative: 1 %
Eosinophils Absolute: 0 10*3/uL (ref 0.0–0.5)
Eosinophils Relative: 1 %
HCT: 42.2 % (ref 36.0–46.0)
Hemoglobin: 14.5 g/dL (ref 12.0–15.0)
Immature Granulocytes: 0 %
Lymphocytes Relative: 33 %
Lymphs Abs: 1.8 10*3/uL (ref 0.7–4.0)
MCH: 29.7 pg (ref 26.0–34.0)
MCHC: 34.4 g/dL (ref 30.0–36.0)
MCV: 86.3 fL (ref 80.0–100.0)
Monocytes Absolute: 0.4 10*3/uL (ref 0.1–1.0)
Monocytes Relative: 8 %
Neutro Abs: 3 10*3/uL (ref 1.7–7.7)
Neutrophils Relative %: 57 %
Platelets: 269 10*3/uL (ref 150–400)
RBC: 4.89 MIL/uL (ref 3.87–5.11)
RDW: 12.7 % (ref 11.5–15.5)
WBC: 5.3 10*3/uL (ref 4.0–10.5)
nRBC: 0 % (ref 0.0–0.2)

## 2020-11-22 LAB — COMPREHENSIVE METABOLIC PANEL
ALT: 19 U/L (ref 0–44)
AST: 17 U/L (ref 15–41)
Albumin: 4.1 g/dL (ref 3.5–5.0)
Alkaline Phosphatase: 46 U/L (ref 38–126)
Anion gap: 10 (ref 5–15)
BUN: 13 mg/dL (ref 8–23)
CO2: 23 mmol/L (ref 22–32)
Calcium: 9.2 mg/dL (ref 8.9–10.3)
Chloride: 106 mmol/L (ref 98–111)
Creatinine, Ser: 0.48 mg/dL (ref 0.44–1.00)
GFR, Estimated: >60 mL/min (ref >60)
Glucose, Bld: 179 mg/dL — ABNORMAL HIGH (ref 70–99)
Potassium: 3.1 mmol/L — ABNORMAL LOW (ref 3.5–5.1)
Sodium: 139 mmol/L (ref 135–145)
Total Bilirubin: 0.5 mg/dL (ref 0.3–1.2)
Total Protein: 6.3 g/dL — ABNORMAL LOW (ref 6.5–8.1)

## 2020-11-22 IMAGING — CT CT HEAD W/O CM
4 series · 17 of 47 positions shown, 19 images · non-contrast
Comparison: None.

CLINICAL DATA: Persistent headache

EXAM:
CT HEAD WITHOUT CONTRAST
TECHNIQUE: Contiguous axial images were obtained from the base of the skull
through the vertex without intravenous contrast.

[Series 2: head wo · axial · 0.42mm/px · z∈[+1062,+1182]mm · 7 of 32 slices shown, 9 images]
[im 4/32  brain]
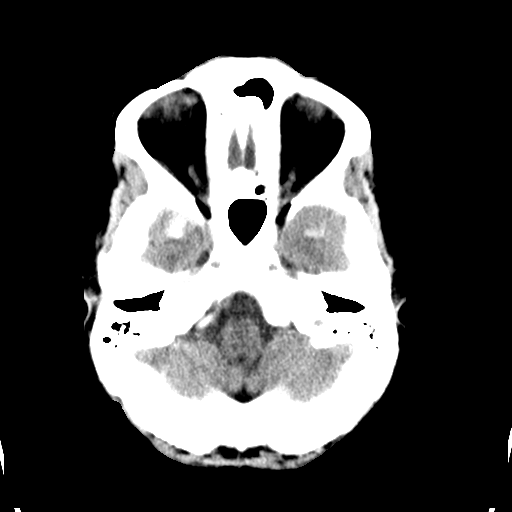
[im 4/32  bone]
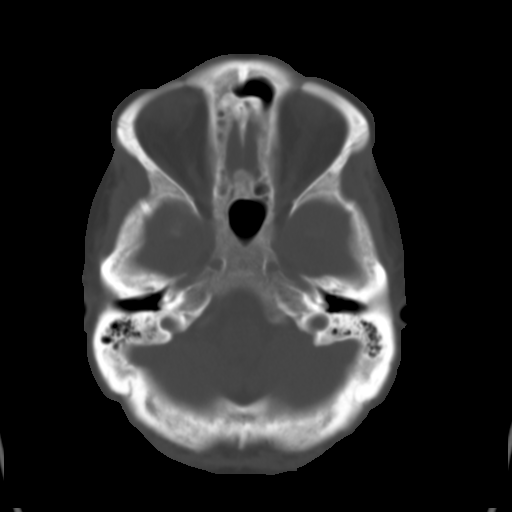
[im 8/32  brain]
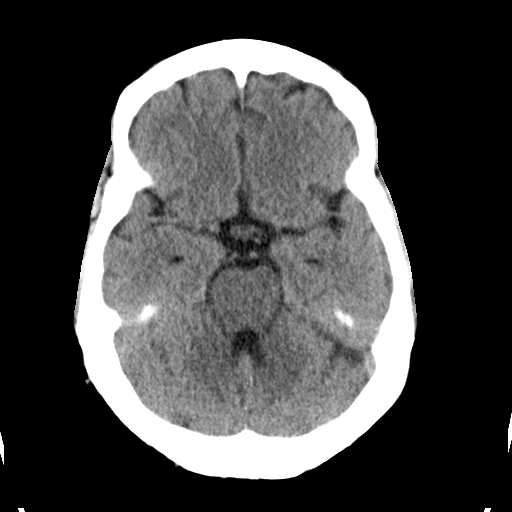
[im 12/32  brain]
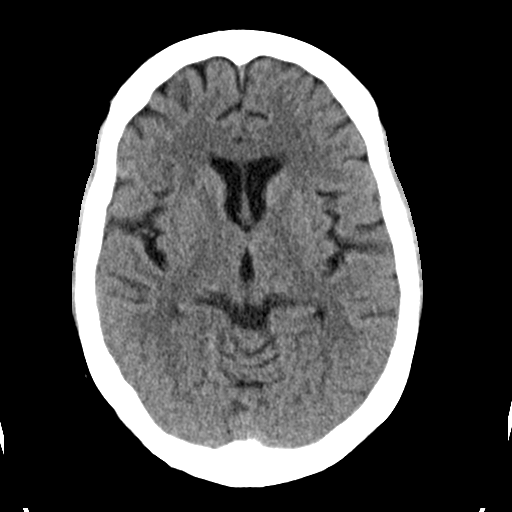
[im 16/32  brain]
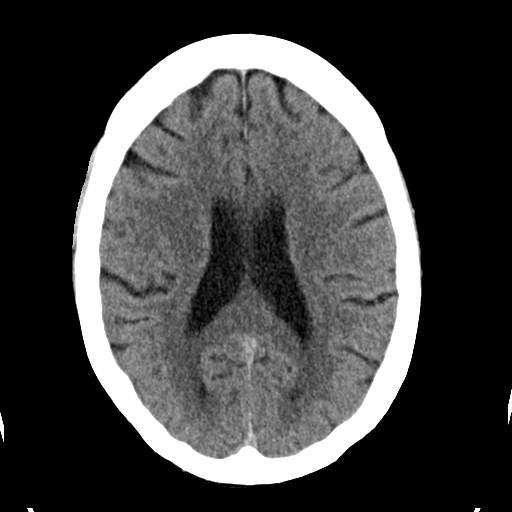
[im 20/32  brain]
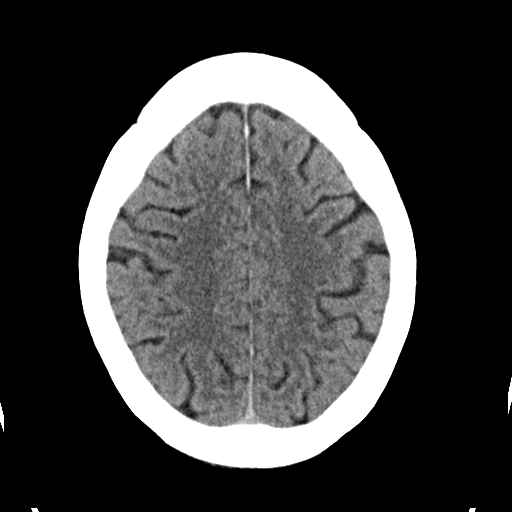
[im 20/32  bone]
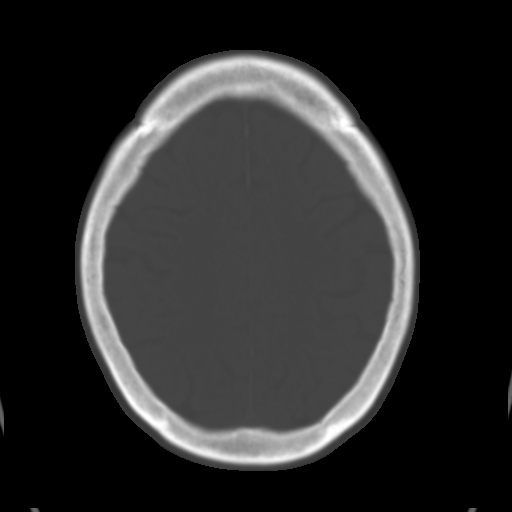
[im 24/32  brain]
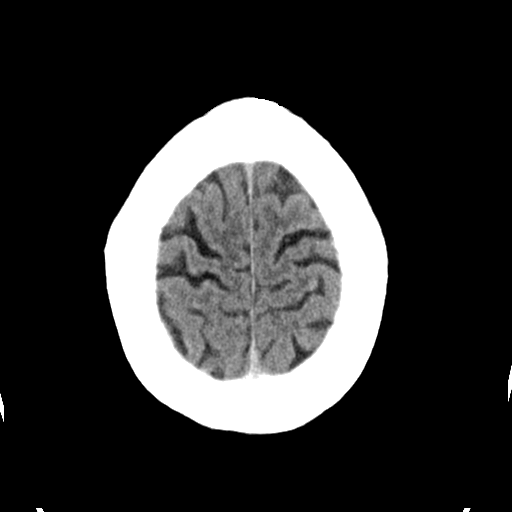
[im 28/32  brain]
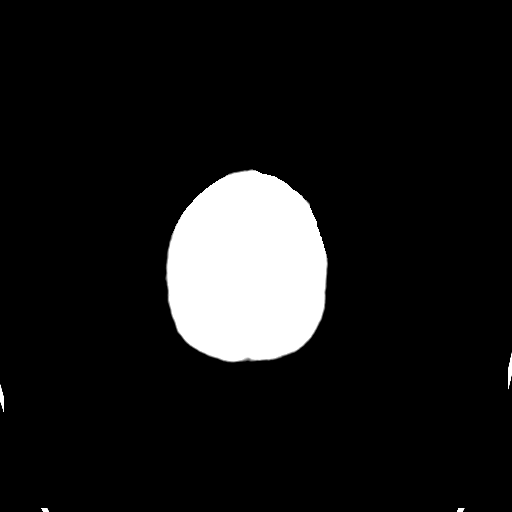

[Series 3: head bone · axial · 0.42mm/px · z∈[+1061,+1117]mm · 4 of 79 slices shown]
[im 8/79  bone]
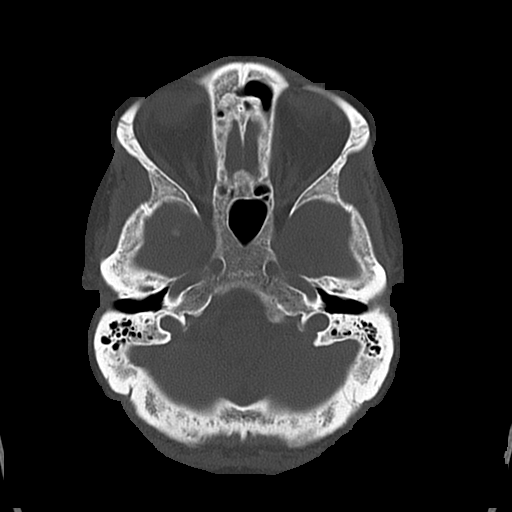
[im 16/79  bone]
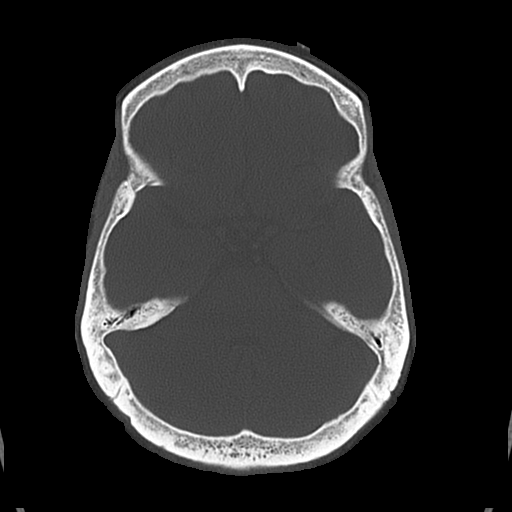
[im 24/79  bone]
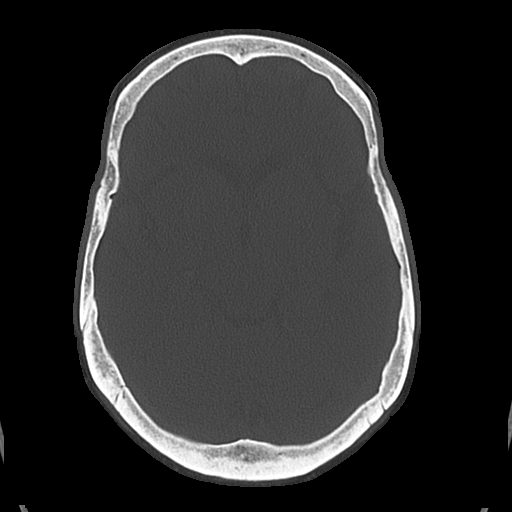
[im 36/79  bone]
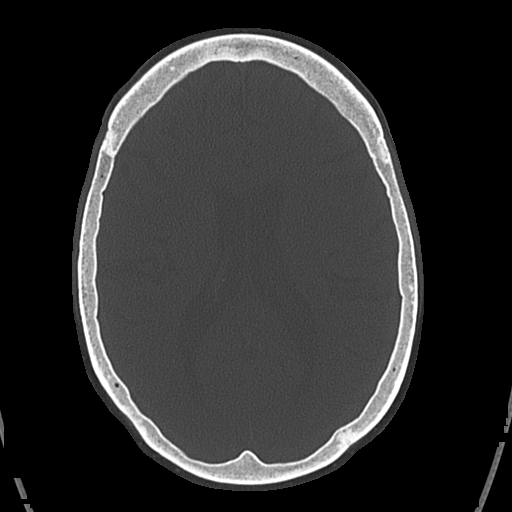

[Series 4: coronal soft · coronal · 0.32mm/px · 3 of 68 slices shown]
[im 23/68  brain]
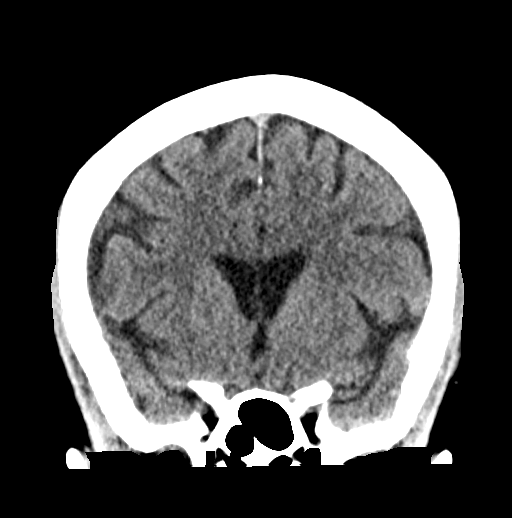
[im 30/68  brain]
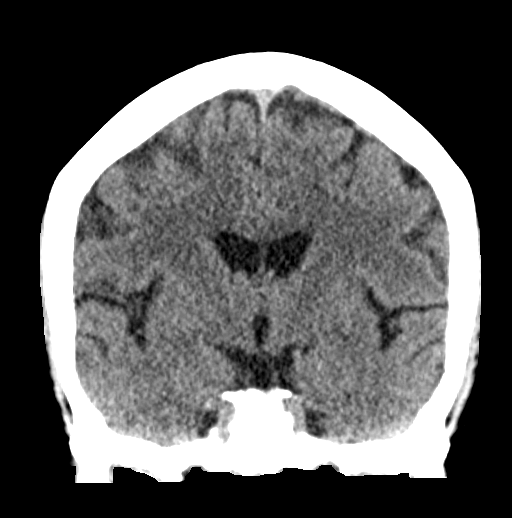
[im 38/68  brain]
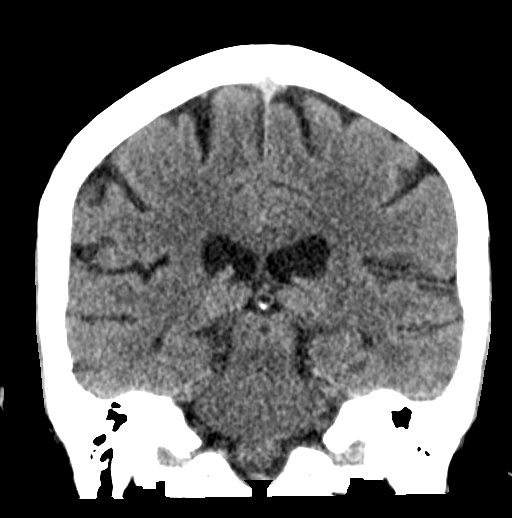

[Series 5: sagittal soft · sagittal · 0.32mm/px · 3 of 54 slices shown]
[im 18/54  brain]
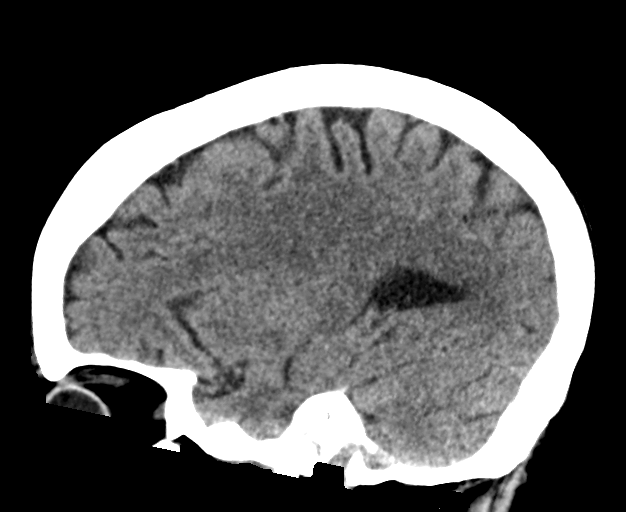
[im 27/54  brain]
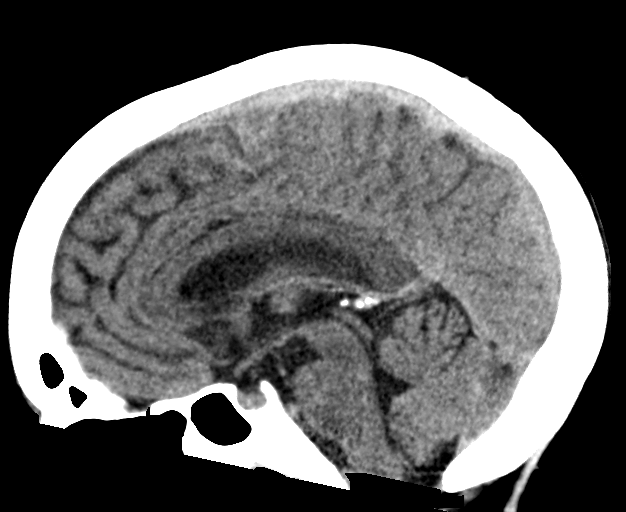
[im 36/54  brain]
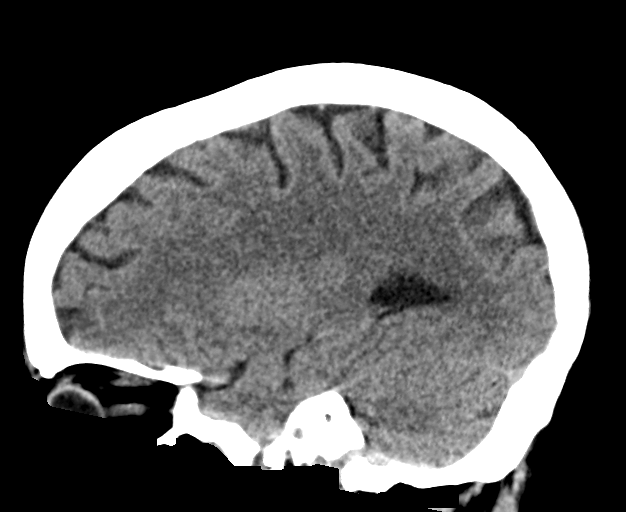

[17 of 47 positions shown; findings below may reference images not displayed]

FINDINGS: Brain: No evidence of acute infarction, hemorrhage, hydrocephalus,
extra-axial collection or mass lesion/mass effect.

Vascular: No hyperdense vessel or unexpected calcification.

Skull: Normal. Negative for fracture or focal lesion.

Sinuses/Orbits: No acute finding.

Other: None.
IMPRESSION: Normal head CT.

## 2020-11-22 MED ORDER — DIPHENHYDRAMINE HCL 50 MG/ML IJ SOLN
25.0000 mg | Freq: Once | INTRAMUSCULAR | Status: AC
  Administered 2020-11-22: 25 mg via INTRAVENOUS
  Filled 2020-11-22: qty 1

## 2020-11-22 MED ORDER — DROPERIDOL 2.5 MG/ML IJ SOLN
1.2500 mg | Freq: Once | INTRAMUSCULAR | Status: AC
  Administered 2020-11-22: 1.25 mg via INTRAVENOUS
  Filled 2020-11-22: qty 2

## 2020-11-22 MED ORDER — PROCHLORPERAZINE EDISYLATE 10 MG/2ML IJ SOLN
10.0000 mg | Freq: Once | INTRAMUSCULAR | Status: AC
  Administered 2020-11-22: 10 mg via INTRAVENOUS
  Filled 2020-11-22: qty 2

## 2020-11-22 MED ORDER — MAGNESIUM OXIDE -MG SUPPLEMENT 400 (240 MG) MG PO TABS
800.0000 mg | ORAL_TABLET | Freq: Once | ORAL | Status: AC
  Administered 2020-11-22: 800 mg via ORAL
  Filled 2020-11-22: qty 2

## 2020-11-22 MED ORDER — POTASSIUM CHLORIDE CRYS ER 20 MEQ PO TBCR: 40.0000 meq | EXTENDED_RELEASE_TABLET | Freq: Once | ORAL | Status: DC

## 2020-11-22 MED ORDER — HYDRALAZINE HCL 20 MG/ML IJ SOLN
10.0000 mg | Freq: Once | INTRAMUSCULAR | Status: AC
  Administered 2020-11-22: 10 mg via INTRAVENOUS
  Filled 2020-11-22: qty 1

## 2020-11-22 MED ORDER — POTASSIUM CHLORIDE CRYS ER 20 MEQ PO TBCR
60.0000 meq | EXTENDED_RELEASE_TABLET | Freq: Once | ORAL | Status: AC
  Administered 2020-11-22: 60 meq via ORAL
  Filled 2020-11-22: qty 3

## 2020-11-22 MED ORDER — LACTATED RINGERS IV BOLUS
500.0000 mL | Freq: Once | INTRAVENOUS | Status: AC
  Administered 2020-11-22: 500 mL via INTRAVENOUS

## 2020-11-22 NOTE — ED Notes (Signed)
Pt took her BP pills this morning at approx 730am.

## 2020-11-22 NOTE — Discharge Instructions (Signed)
You were seen in the emergency department for evaluation of a headache.  Your blood pressure was elevated at the time of the headache, but by getting the pain under control, your blood pressure improved significantly.  Please call your primary care physician to discuss appropriate blood pressure control.  In addition, we performed CT imaging and blood work which was reassuringly negative for any bleeding in the brain or masses in the brain and your lab work showed some low potassium that we fixed here in the emergency department.  At this time you are safe for discharge, but if your headache returns, please return to the emergency department for further evaluation.  Please call your primary care physician to discuss the need for possible neurologic evaluation if your headaches are persistent.

## 2020-11-22 NOTE — ED Triage Notes (Signed)
Pt reports headache for 2 weeks, has taken tylenol at home with some relief, but headache persists.  Pt took tylenol 1500mg  at approx 7am this morning.

## 2020-11-22 NOTE — ED Provider Notes (Signed)
[facility] EMERGENCY DEPT Provider Note   CSN: [id number] Arrival date & time: 11/22/20  3976     History Chief Complaint  Patient presents with   Headache    Madison Wilson is a 75 y.o. female with PMH HTN, DM, HLD who presents the emergency department for evaluation of a headache.  States that over the last 2 weeks she has had a persistent headache that has been gradual in onset, and persistent at 6 out of 10.  No thunderclap headache.  She denies numbness, tingling, weakness of the extremities, visual deficits or confusion.  She states that she is on hypertensive medications and she took them prior to coming to the emergency department today but arrives with systolics greater than 200.  She states that she feels her blood pressure is elevated due to her pain.  Denies chest pain, shortness of breath, abdominal pain, nausea, vomiting or other systemic symptoms.   Headache Associated symptoms: no abdominal pain, no back pain, no cough, no ear pain, no eye pain, no fever, no seizures, no sore throat and no vomiting       Past Medical History:  Diagnosis Date   Diabetes mellitus without complication (HCC)    Hyperlipidemia    Hypertension     There are no problems to display for this patient.   Past Surgical History:  Procedure Laterality Date   ANKLE FRACTURE SURGERY     BREAST EXCISIONAL BIOPSY Bilateral      OB History   No obstetric history on file.     Family History  Problem Relation Age of Onset   Heart disease Other    Hyperlipidemia Other    Hypertension Other    Stroke Other    Asthma Other    Breast cancer Sister    Breast cancer Sister     Social History   Tobacco Use   Smoking status: Never   Smokeless tobacco: Never  Substance Use Topics   Alcohol use: No   Drug use: No    Home Medications Prior to Admission medications   Medication Sig Start Date End Date Taking? Authorizing Provider  amLODipine (NORVASC) 2.5 MG tablet  Take 2.5 mg by mouth daily.   Yes [provider]  verapamil (CALAN) 120 MG tablet Take 120 mg by mouth once.   Yes [provider]  atorvastatin (LIPITOR) 10 MG tablet Take 10 mg by mouth daily.    [provider]  azithromycin (ZITHROMAX) 250 MG tablet Take 2 po first day and then one po qd x 4 days 11/14/13   Deatra Canter, FNP  CALCIUM PO Take 1 tablet by mouth 2 (two) times daily.    [provider]  Cholecalciferol (VITAMIN D) 2000 UNITS CAPS Take 1 capsule by mouth daily.    [provider]  HYDROcodone-homatropine (HYCODAN) 5-1.5 MG/5ML syrup Take 5 mLs by mouth every 8 (eight) hours as needed for cough. 11/14/13   Deatra Canter, FNP  Multiple Vitamin (MULTIVITAMIN WITH MINERALS) TABS Take 1 tablet by mouth daily.    [provider]  omeprazole (PRILOSEC) 20 MG capsule Take 20 mg by mouth daily.    [provider]    Allergies    Prednisone, Amoxicillin, and Sulfa antibiotics  Review of Systems   Review of Systems  Constitutional:  Negative for chills and fever.  HENT:  Negative for ear pain and sore throat.   Eyes:  Negative for pain and visual disturbance.  Respiratory:  Negative for cough and shortness of breath.   Cardiovascular:  Negative for chest pain and palpitations.  Gastrointestinal:  Negative for abdominal pain and vomiting.  Genitourinary:  Negative for dysuria and hematuria.  Musculoskeletal:  Negative for arthralgias and back pain.  Skin:  Negative for color change and rash.  Neurological:  Positive for headaches. Negative for seizures and syncope.  All other systems reviewed and are negative.  Physical Exam Updated Vital Signs BP (!) 150/43   Pulse 62   Temp 98.5 F (36.9 C) (Oral)   Resp 16   Ht 5\' 1"  (1.549 m)   Wt 64.9 kg   SpO2 98%   BMI 27.02 kg/m   Physical Exam Vitals and nursing note reviewed.  Constitutional:      General: She is not in acute distress.    Appearance:  She is well-developed.  HENT:     Head: Normocephalic and atraumatic.  Eyes:     Conjunctiva/sclera: Conjunctivae normal.  Cardiovascular:     Rate and Rhythm: Normal rate and regular rhythm.     Heart sounds: No murmur heard. Pulmonary:     Effort: Pulmonary effort is normal. No respiratory distress.     Breath sounds: Normal breath sounds.  Abdominal:     Palpations: Abdomen is soft.     Tenderness: There is no abdominal tenderness.  Musculoskeletal:     Cervical back: Neck supple.  Skin:    General: Skin is warm and dry.  Neurological:     Mental Status: She is alert.     Cranial Nerves: No cranial nerve deficit or dysarthria.     Sensory: No sensory deficit.     Motor: No weakness.    ED Results / Procedures / Treatments   Labs (all labs ordered are listed, but only abnormal results are displayed) Labs Reviewed  COMPREHENSIVE METABOLIC PANEL - Abnormal; Notable for the following components:      Result Value   Potassium 3.1 (*)    Glucose, Bld 179 (*)    Total Protein 6.3 (*)    All other components within normal limits  CBC WITH DIFFERENTIAL/PLATELET    EKG None  Radiology CT Head Wo Contrast  Result Date: 11/22/2020 CLINICAL DATA:  Persistent headache EXAM: CT HEAD WITHOUT CONTRAST TECHNIQUE: Contiguous axial images were obtained from the base of the skull through the vertex without intravenous contrast. COMPARISON:  None. FINDINGS: Brain: No evidence of acute infarction, hemorrhage, hydrocephalus, extra-axial collection or mass lesion/mass effect. Vascular: No hyperdense vessel or unexpected calcification. Skull: Normal. Negative for fracture or focal lesion. Sinuses/Orbits: No acute finding. Other: None. IMPRESSION: Normal head CT. Electronically Signed   By: 11/24/2020 M.D.   On: 11/22/2020 10:39    Procedures Procedures   Medications Ordered in ED Medications  prochlorperazine (COMPAZINE) injection 10 mg (10 mg Intravenous Given 11/22/20 1036)   diphenhydrAMINE (BENADRYL) injection 25 mg (25 mg Intravenous Given 11/22/20 1037)  lactated ringers bolus 500 mL (500 mLs Intravenous New Bag/Given 11/22/20 1044)  hydrALAZINE (APRESOLINE) injection 10 mg (10 mg Intravenous Given 11/22/20 1213)  droperidol (INAPSINE) 2.5 MG/ML injection 1.25 mg (1.25 mg Intravenous Given 11/22/20 1345)  magnesium oxide (MAG-OX) tablet 800 mg (800 mg Oral Given 11/22/20 1336)  potassium chloride SA (KLOR-CON) CR tablet 60 mEq (60 mEq Oral Given 11/22/20 1337)    ED Course  I have reviewed the triage vital signs and the nursing notes.  Pertinent labs & imaging results that were available during my care of  the patient were reviewed by me and considered in my medical decision making (see chart for details).    MDM Rules/Calculators/A&P                           Patient seen the emergency department for evaluation of a headache.  Physical exam is unremarkable.  Laboratory evaluation reveals a hypokalemia to 3.1 and this was repleted with oral potassium and oral magnesium.  A CT head was performed to evaluate for subdural hematoma or mass lesion as the cause the patient's headache which was reassuringly negative.  Patient was given a headache cocktail which improved her pain from a 6 out of 10 to a 4 out of 10.  She then received a single dose of droperidol which resolved her symptoms.  She was given a dose of hydralazine that did not improve her blood pressure, but after the headache medications, her blood pressure normalized into the 140s.  Patient was then discharged with primary care follow-up with instructions to return the emergency department if her headache worsens, she has any neurologic symptoms or any persistent vomiting.  Patient voiced understanding this and was discharged. Final Clinical Impression(s) / ED Diagnoses Final diagnoses:  Nonintractable headache, unspecified chronicity pattern, unspecified headache type    Rx / DC Orders ED Discharge  Orders     None        Claire Dolores, Wyn Forster, MD 11/22/20 [phone]

## 2020-11-25 ENCOUNTER — Emergency Department (HOSPITAL_BASED_OUTPATIENT_CLINIC_OR_DEPARTMENT_OTHER): Payer: Medicare PPO

## 2020-11-25 ENCOUNTER — Inpatient Hospital Stay (HOSPITAL_BASED_OUTPATIENT_CLINIC_OR_DEPARTMENT_OTHER)
Admission: EM | Admit: 2020-11-25 | Discharge: 2020-11-27 | DRG: 638 | Disposition: A | Discharge status: Home / Self Care | Payer: Medicare PPO | Attending: Internal Medicine | Admitting: Internal Medicine

## 2020-11-25 ENCOUNTER — Other Ambulatory Visit: Payer: Self-pay

## 2020-11-25 ENCOUNTER — Encounter (HOSPITAL_BASED_OUTPATIENT_CLINIC_OR_DEPARTMENT_OTHER): Payer: Self-pay | Admitting: Obstetrics and Gynecology

## 2020-11-25 DIAGNOSIS — E1010 Type 1 diabetes mellitus with ketoacidosis without coma: Secondary | ICD-10-CM

## 2020-11-25 DIAGNOSIS — I10 Essential (primary) hypertension: Secondary | ICD-10-CM | POA: Diagnosis present

## 2020-11-25 DIAGNOSIS — K219 Gastro-esophageal reflux disease without esophagitis: Secondary | ICD-10-CM | POA: Diagnosis present

## 2020-11-25 DIAGNOSIS — Z888 Allergy status to other drugs, medicaments and biological substances status: Secondary | ICD-10-CM | POA: Diagnosis not present

## 2020-11-25 DIAGNOSIS — Z79899 Other long term (current) drug therapy: Secondary | ICD-10-CM

## 2020-11-25 DIAGNOSIS — I160 Hypertensive urgency: Secondary | ICD-10-CM | POA: Diagnosis present

## 2020-11-25 DIAGNOSIS — E860 Dehydration: Secondary | ICD-10-CM | POA: Diagnosis present

## 2020-11-25 DIAGNOSIS — I6501 Occlusion and stenosis of right vertebral artery: Secondary | ICD-10-CM | POA: Diagnosis not present

## 2020-11-25 DIAGNOSIS — R4701 Aphasia: Secondary | ICD-10-CM | POA: Diagnosis present

## 2020-11-25 DIAGNOSIS — Z8249 Family history of ischemic heart disease and other diseases of the circulatory system: Secondary | ICD-10-CM | POA: Diagnosis not present

## 2020-11-25 DIAGNOSIS — R410 Disorientation, unspecified: Secondary | ICD-10-CM | POA: Diagnosis not present

## 2020-11-25 DIAGNOSIS — R112 Nausea with vomiting, unspecified: Secondary | ICD-10-CM | POA: Diagnosis not present

## 2020-11-25 DIAGNOSIS — Z20822 Contact with and (suspected) exposure to covid-19: Secondary | ICD-10-CM | POA: Diagnosis present

## 2020-11-25 DIAGNOSIS — Z7984 Long term (current) use of oral hypoglycemic drugs: Secondary | ICD-10-CM

## 2020-11-25 DIAGNOSIS — I6613 Occlusion and stenosis of bilateral anterior cerebral arteries: Secondary | ICD-10-CM | POA: Diagnosis not present

## 2020-11-25 DIAGNOSIS — E876 Hypokalemia: Secondary | ICD-10-CM | POA: Diagnosis present

## 2020-11-25 DIAGNOSIS — I6521 Occlusion and stenosis of right carotid artery: Secondary | ICD-10-CM | POA: Diagnosis present

## 2020-11-25 DIAGNOSIS — Z880 Allergy status to penicillin: Secondary | ICD-10-CM | POA: Diagnosis not present

## 2020-11-25 DIAGNOSIS — Z882 Allergy status to sulfonamides status: Secondary | ICD-10-CM | POA: Diagnosis not present

## 2020-11-25 DIAGNOSIS — E1110 Type 2 diabetes mellitus with ketoacidosis without coma: Secondary | ICD-10-CM | POA: Diagnosis present

## 2020-11-25 DIAGNOSIS — R519 Headache, unspecified: Secondary | ICD-10-CM

## 2020-11-25 DIAGNOSIS — G43109 Migraine with aura, not intractable, without status migrainosus: Secondary | ICD-10-CM | POA: Diagnosis present

## 2020-11-25 DIAGNOSIS — E785 Hyperlipidemia, unspecified: Secondary | ICD-10-CM | POA: Diagnosis present

## 2020-11-25 DIAGNOSIS — R4182 Altered mental status, unspecified: Secondary | ICD-10-CM | POA: Diagnosis present

## 2020-11-25 DIAGNOSIS — I6603 Occlusion and stenosis of bilateral middle cerebral arteries: Secondary | ICD-10-CM | POA: Diagnosis not present

## 2020-11-25 DIAGNOSIS — D72829 Elevated white blood cell count, unspecified: Secondary | ICD-10-CM | POA: Diagnosis present

## 2020-11-25 DIAGNOSIS — G4489 Other headache syndrome: Secondary | ICD-10-CM | POA: Diagnosis not present

## 2020-11-25 DIAGNOSIS — R404 Transient alteration of awareness: Secondary | ICD-10-CM | POA: Diagnosis not present

## 2020-11-25 LAB — I-STAT ARTERIAL BLOOD GAS, ED
Acid-base deficit: 5 mmol/L — ABNORMAL HIGH (ref 0.0–2.0)
Bicarbonate: 16.6 mmol/L — ABNORMAL LOW (ref 20.0–28.0)
Calcium, Ion: 1.16 mmol/L (ref 1.15–1.40)
HCT: 42 % (ref 36.0–46.0)
Hemoglobin: 14.3 g/dL (ref 12.0–15.0)
O2 Saturation: 99 %
Patient temperature: 98.6
Potassium: 4.2 mmol/L (ref 3.5–5.1)
Sodium: 134 mmol/L — ABNORMAL LOW (ref 135–145)
TCO2: 17 mmol/L — ABNORMAL LOW (ref 22–32)
pCO2 arterial: 22.5 mmHg — ABNORMAL LOW (ref 32.0–48.0)
pH, Arterial: 7.476 — ABNORMAL HIGH (ref 7.350–7.450)
pO2, Arterial: 109 mmHg — ABNORMAL HIGH (ref 83.0–108.0)

## 2020-11-25 LAB — LACTIC ACID, PLASMA
Lactic Acid, Venous: 2.7 mmol/L (ref 0.5–1.9)
Lactic Acid, Venous: 1.6 mmol/L (ref 0.5–1.9)

## 2020-11-25 LAB — COMPREHENSIVE METABOLIC PANEL
ALT: 34 U/L (ref 0–44)
AST: 24 U/L (ref 15–41)
Albumin: 4.7 g/dL (ref 3.5–5.0)
Alkaline Phosphatase: 64 U/L (ref 38–126)
Anion gap: 21 — ABNORMAL HIGH (ref 5–15)
BUN: 12 mg/dL (ref 8–23)
CO2: 17 mmol/L — ABNORMAL LOW (ref 22–32)
Calcium: 9.6 mg/dL (ref 8.9–10.3)
Chloride: 97 mmol/L — ABNORMAL LOW (ref 98–111)
Creatinine, Ser: 0.6 mg/dL (ref 0.44–1.00)
GFR, Estimated: >60 mL/min (ref >60)
Glucose, Bld: 170 mg/dL — ABNORMAL HIGH (ref 70–99)
Potassium: 3.5 mmol/L (ref 3.5–5.1)
Sodium: 135 mmol/L (ref 135–145)
Total Bilirubin: 0.7 mg/dL (ref 0.3–1.2)
Total Protein: 7.4 g/dL (ref 6.5–8.1)

## 2020-11-25 LAB — PROTIME-INR
INR: 0.9 (ref 0.8–1.2)
Prothrombin Time: 12.6 seconds (ref 11.4–15.2)

## 2020-11-25 LAB — ETHANOL: Alcohol, Ethyl (B): <10 mg/dL (ref <10)

## 2020-11-25 LAB — URINALYSIS, ROUTINE W REFLEX MICROSCOPIC
Bilirubin Urine: NEGATIVE
Glucose, UA: >1000 mg/dL — AB
Hgb urine dipstick: NEGATIVE
Ketones, ur: >80 mg/dL — AB
Leukocytes,Ua: NEGATIVE
Nitrite: NEGATIVE
Specific Gravity, Urine: 1.041 — ABNORMAL HIGH (ref 1.005–1.030)
pH: 5.5 (ref 5.0–8.0)

## 2020-11-25 LAB — BASIC METABOLIC PANEL
Anion gap: 17 — ABNORMAL HIGH (ref 5–15)
BUN: 11 mg/dL (ref 8–23)
CO2: 17 mmol/L — ABNORMAL LOW (ref 22–32)
Calcium: 9.2 mg/dL (ref 8.9–10.3)
Chloride: 102 mmol/L (ref 98–111)
Creatinine, Ser: 0.7 mg/dL (ref 0.44–1.00)
GFR, Estimated: >60 mL/min (ref >60)
Glucose, Bld: 146 mg/dL — ABNORMAL HIGH (ref 70–99)
Potassium: 4 mmol/L (ref 3.5–5.1)
Sodium: 136 mmol/L (ref 135–145)

## 2020-11-25 LAB — CBC WITH DIFFERENTIAL/PLATELET
Abs Immature Granulocytes: 0.05 10*3/uL (ref 0.00–0.07)
Basophils Absolute: 0 10*3/uL (ref 0.0–0.1)
Basophils Relative: 0 %
Eosinophils Absolute: 0 10*3/uL (ref 0.0–0.5)
Eosinophils Relative: 0 %
HCT: 46.2 % — ABNORMAL HIGH (ref 36.0–46.0)
Hemoglobin: 15.7 g/dL — ABNORMAL HIGH (ref 12.0–15.0)
Immature Granulocytes: 0 %
Lymphocytes Relative: 11 %
Lymphs Abs: 1.6 10*3/uL (ref 0.7–4.0)
MCH: 29.3 pg (ref 26.0–34.0)
MCHC: 34 g/dL (ref 30.0–36.0)
MCV: 86.2 fL (ref 80.0–100.0)
Monocytes Absolute: 0.4 10*3/uL (ref 0.1–1.0)
Monocytes Relative: 3 %
Neutro Abs: 11.8 10*3/uL — ABNORMAL HIGH (ref 1.7–7.7)
Neutrophils Relative %: 86 %
Platelets: 389 10*3/uL (ref 150–400)
RBC: 5.36 MIL/uL — ABNORMAL HIGH (ref 3.87–5.11)
RDW: 12.4 % (ref 11.5–15.5)
WBC: 13.8 10*3/uL — ABNORMAL HIGH (ref 4.0–10.5)
nRBC: 0 % (ref 0.0–0.2)

## 2020-11-25 LAB — RAPID URINE DRUG SCREEN, HOSP PERFORMED
Amphetamines: NOT DETECTED
Barbiturates: NOT DETECTED
Benzodiazepines: NOT DETECTED
Cocaine: NOT DETECTED
Opiates: NOT DETECTED
Tetrahydrocannabinol: NOT DETECTED

## 2020-11-25 LAB — RESP PANEL BY RT-PCR (FLU A&B, COVID) ARPGX2
Influenza A by PCR: NEGATIVE
Influenza B by PCR: NEGATIVE
SARS Coronavirus 2 by RT PCR: NEGATIVE

## 2020-11-25 LAB — PHOSPHORUS: Phosphorus: 3.1 mg/dL (ref 2.5–4.6)

## 2020-11-25 LAB — TSH: TSH: 0.73 u[IU]/mL (ref 0.350–4.500)

## 2020-11-25 LAB — SEDIMENTATION RATE: Sed Rate: 3 mm/hr (ref 0–22)

## 2020-11-25 LAB — MAGNESIUM: Magnesium: 1.8 mg/dL (ref 1.7–2.4)

## 2020-11-25 LAB — CBG MONITORING, ED: Glucose-Capillary: 135 mg/dL — ABNORMAL HIGH (ref 70–99)

## 2020-11-25 IMAGING — CT CT ANGIO HEAD-NECK (W OR W/O PERF)
2 of 11 series · 6 of 36 positions shown · IV contrast (omnipaque)
Comparison: None.

CLINICAL DATA: Altered mental status and headache

EXAM:
CT ANGIOGRAPHY HEAD AND NECK
TECHNIQUE: Multidetector CT imaging of the head and neck was performed using
the standard protocol during bolus administration of intravenous
contrast. Multiplanar CT image reconstructions and MIPs were
obtained to evaluate the vascular anatomy. Carotid stenosis
measurements (when applicable) are obtained utilizing NASCET
criteria, using the distal internal carotid diameter as the
denominator.
CONTRAST:  75mL OMNIPAQUE IOHEXOL 350 MG/ML SOLN

[Series 10: ax thin · axial · 0.60mm/px · z∈[-282,-26]mm · 5 of 388 slices shown]
[im 65/388  soft-tissue]
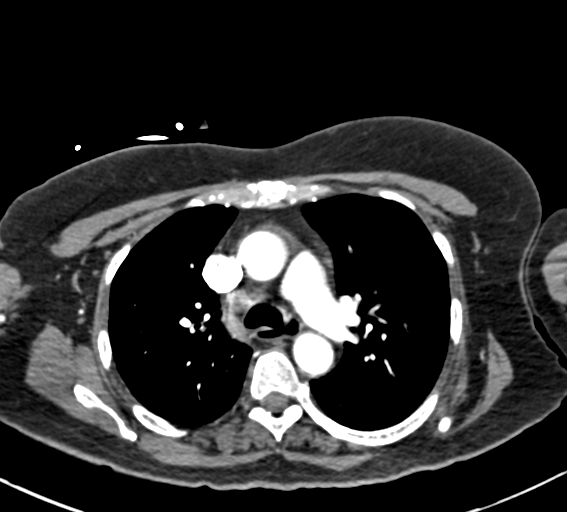
[im 130/388  bone]
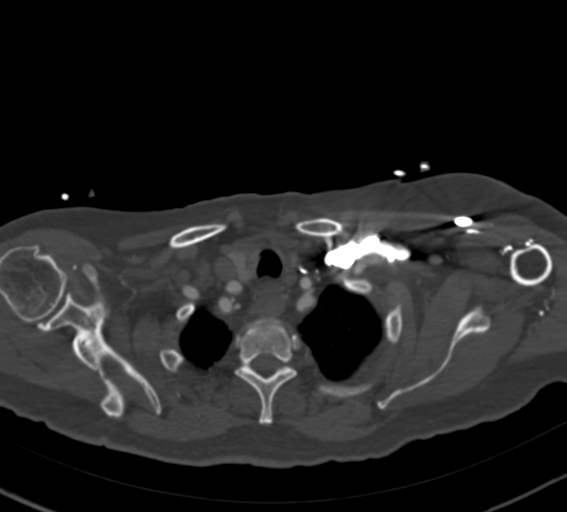
[im 194/388  soft-tissue]
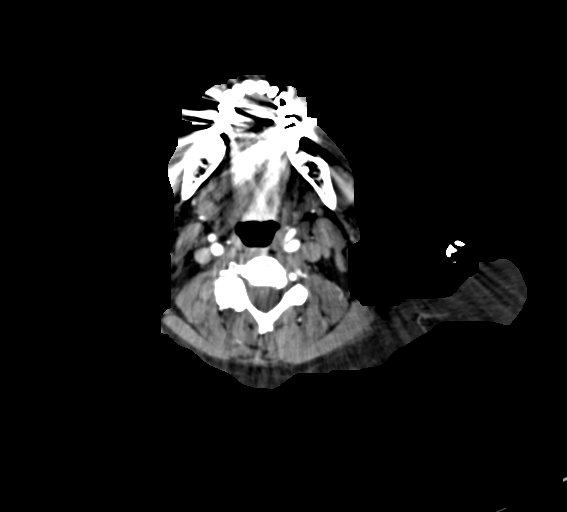
[im 259/388  bone]
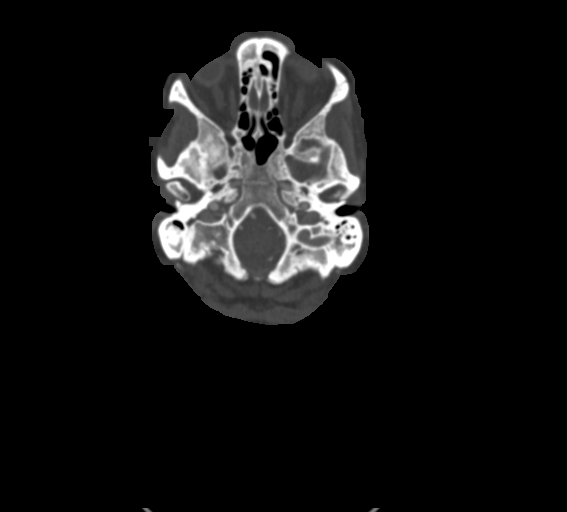
[im 323/388  soft-tissue]
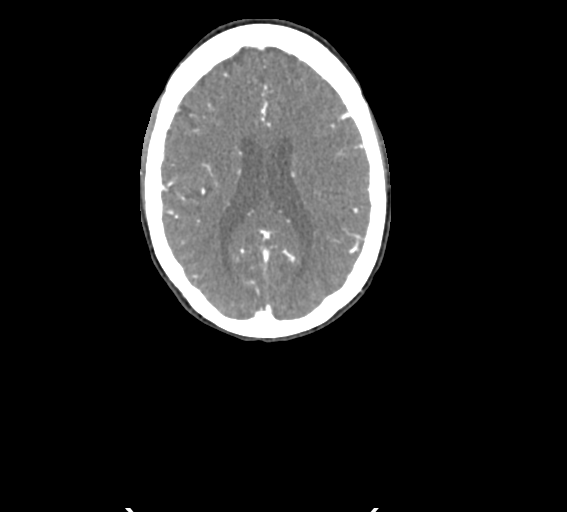

[Series 12: sag thin · sagittal · 0.53mm/px · 1 of 319 slices shown]
[im 94/319  soft-tissue]
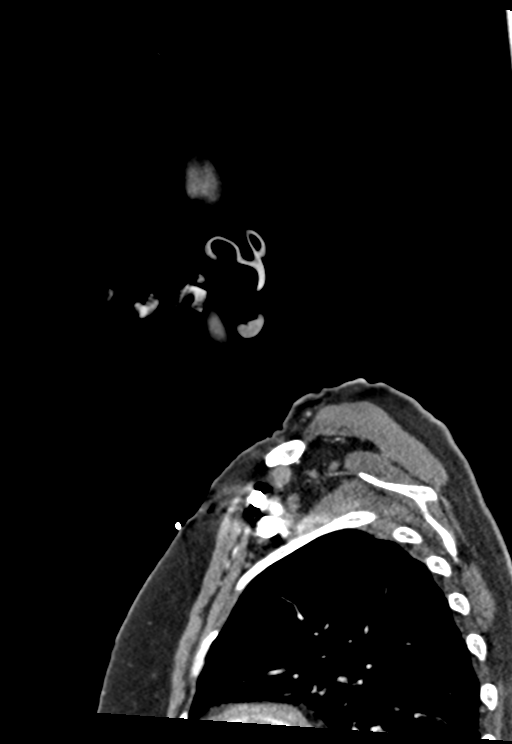

[6 of 36 positions shown; findings below may reference images not displayed]

FINDINGS: CT HEAD

Brain: There is no acute intracranial hemorrhage, mass effect, or
edema. Gray-white differentiation is preserved. There is no
extra-axial fluid collection. Ventricles and sulci are within normal
limits in size and configuration.

Vascular: No hyperdense vessel .

Skull: Calvarium is unremarkable.

Sinuses/Orbits: No acute finding.

Other: None.

Review of the MIP images confirms the above findings

CTA NECK

Aortic arch: Great vessel origins are patent.

Right carotid system: Patent. Mixed plaque along the proximal
internal carotid causing less than 50% stenosis. Common carotid
retropharyngeal course.

Left carotid system: Patent. No stenosis. Common carotid
retropharyngeal course.

Vertebral arteries: Patent and codominant.  No stenosis.

Skeleton: Right facet predominant cervical spine degenerative
changes.

Other neck: Unremarkable.

Upper chest: Included upper lungs are clear.

Review of the MIP images confirms the above findings

CTA HEAD

Anterior circulation: Intracranial internal carotid arteries are
patent. Anterior and middle cerebral arteries are patent.

Posterior circulation: Intracranial vertebral arteries are patent.
Basilar artery is patent. Major cerebellar artery origins are
patent. Posterior communicating arteries are present bilaterally.
Posterior cerebral arteries are patent.

Venous sinuses: Patent as allowed by contrast bolus timing.

Review of the MIP images confirms the above findings
IMPRESSION: No acute intracranial abnormality.

No large vessel occlusion, hemodynamically significant stenosis, or
evidence of dissection.

Plaque at the proximal right ICA causes less than 50% stenosis.

## 2020-11-25 MED ORDER — LACTATED RINGERS IV BOLUS
20.0000 mL/kg | Freq: Once | INTRAVENOUS | Status: AC
  Administered 2020-11-25: 1298 mL via INTRAVENOUS

## 2020-11-25 MED ORDER — POTASSIUM CHLORIDE 10 MEQ/100ML IV SOLN
10.0000 meq | INTRAVENOUS | Status: AC
  Administered 2020-11-25 (×2): 10 meq via INTRAVENOUS
  Filled 2020-11-25 (×2): qty 100

## 2020-11-25 MED ORDER — LACTATED RINGERS IV BOLUS
500.0000 mL | Freq: Once | INTRAVENOUS | Status: AC
  Administered 2020-11-25: 500 mL via INTRAVENOUS

## 2020-11-25 MED ORDER — ONDANSETRON HCL 4 MG/2ML IJ SOLN
4.0000 mg | Freq: Once | INTRAMUSCULAR | Status: AC
  Administered 2020-11-25: 4 mg via INTRAVENOUS
  Filled 2020-11-25: qty 2

## 2020-11-25 MED ORDER — LACTATED RINGERS IV SOLN: INTRAVENOUS | Status: DC

## 2020-11-25 MED ORDER — IOHEXOL 350 MG/ML SOLN
80.0000 mL | Freq: Once | INTRAVENOUS | Status: AC | PRN
  Administered 2020-11-25: 75 mL via INTRAVENOUS

## 2020-11-25 MED ORDER — LORAZEPAM 2 MG/ML IJ SOLN: 1.0000 mg | Freq: Once | INTRAMUSCULAR | Status: DC | PRN

## 2020-11-25 MED ORDER — DEXTROSE IN LACTATED RINGERS 5 % IV SOLN: INTRAVENOUS | Status: DC

## 2020-11-25 MED ORDER — INSULIN REGULAR(HUMAN) IN NACL 100-0.9 UT/100ML-% IV SOLN
INTRAVENOUS | Status: DC
  Administered 2020-11-26: 3 [IU]/h via INTRAVENOUS
  Filled 2020-11-25: qty 100

## 2020-11-25 MED ORDER — HYDROMORPHONE HCL 1 MG/ML IJ SOLN
1.0000 mg | Freq: Once | INTRAMUSCULAR | Status: AC
  Administered 2020-11-25: 1 mg via INTRAVENOUS
  Filled 2020-11-25: qty 1

## 2020-11-25 MED ORDER — DEXTROSE 50 % IV SOLN: 0.0000 mL | INTRAVENOUS | Status: DC | PRN

## 2020-11-25 NOTE — ED Notes (Signed)
[facility] called to transport pt to [facility] ED

## 2020-11-25 NOTE — ED Triage Notes (Signed)
Patient reports continued headaches from earlier this week. Patient's husband reports she is incoherent

## 2020-11-25 NOTE — ED Provider Notes (Signed)
Care of the patient assumed on transfer from [facility] for headache, confusion. Advised by Neuro to come for MRI/MRV. She is unable to give any history beyond having a headache.  Physical Exam  BP (!) 208/86   Pulse 76   Temp 97.7 F (36.5 C)   Resp 18   SpO2 96%   Physical Exam Awake and alert Disoriented Moves all extremities  ED Course/Procedures   Clinical Course as of 11/25/20 2305  Thu Nov 25, 2020  2135 Patient here from [facility] to have MRI and see Neuro. Labs reviewed from [facility] UA with glucosuria and ketones, CBC with mild leukocytosis and elevated Hgb, possibly hemoconcentrated. CMP with mild hyperglycemia but anion gap of 21 and low CO2 concerning for metabolic acidosis. Will check ABG, add beta-hydroxybutyrate. Neuro is aware and will evaluate the patient in the ED.  [CS]  2142 Patient unsure of her current medication list but from available records at PCP office in March she was prescribed [place] and Jardiance. 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 Will begin DKA order set and discuss admission with Hospitalist.  [CS]  2238 Spoke with husband at bedside and explained plan for admission. He also does not know her medication list.  Spoke with Dr. Loney Loh, Hospitalist, who will evaluate for admission.  [CS]    Clinical Course User Index [CS] Pollyann Savoy, MD    Procedures  MDM         Pollyann Savoy, MD 11/25/20 [phone]

## 2020-11-25 NOTE — ED Notes (Signed)
Pt ambulated back to room with a stand-by assist and is unable to give a urine sample at this time.

## 2020-11-25 NOTE — ED Notes (Signed)
Patient transported to CT 

## 2020-11-25 NOTE — ED Notes (Signed)
[facility] called for pt transfer to [facility] ED. They do not have trucks avail currently

## 2020-11-25 NOTE — ED Notes (Addendum)
Pt is altered. Pt can follow commands and give name and month and day of DOB but not year. When asked about her pain all she states that it hurts. Pt lethargic but will open eyes spontaneously and follow commands. MD notified

## 2020-11-25 NOTE — ED Notes (Signed)
Transfer from [facility]. Stroke like symptoms. C/o headache and confusion. A&Ox1 per ems. Will follow commands. Pt has had hypertension with history. Pt needs MRI.   191/76 80 heart rate 97% RA  CBG 177

## 2020-11-25 NOTE — ED Provider Notes (Signed)
[facility] EMERGENCY DEPT Provider Note   CSN: [id number] Arrival date & time: 11/25/20  1458     History Chief Complaint  Patient presents with   Headache   Hypertension    Madison Wilson is a 75 y.o. female.  HPI Patient is presenting with a headache and confusion.  Patient is a poor historian at this time.  She is awake and physically well in appearance but putting her head in her hands and saying she has a headache and pain but really not giving history.  Thusly, the details of history are predominantly obtained from the patient's husband.  Headache onset approximately 2 weeks ago.  Reportedly before 2 weeks ago the patient was very well and not having any symptoms.  Possibly URI symptoms 2 weeks ago.  One of the grandchildren had RSV.  The patient is not described as being significantly symptomatic with significant cough or respiratory symptoms but possibly mildly ill.  At some point she started having headaches which was atypical for her.  Seemingly the headaches have been generalized.  Initially described as waxing and waning.  When asked about sleeping more, the patient's husband reports that she seems to be up at night more because she is having pain and then more daytime sleeping.  Patient's headache was such that when she was evaluated in the emergency department 3 days ago.  That time patient denied numbness, tingling, weakness of extremities, visual problems or confusion.  Hypertension was addressed and at that time attributed to pain.  After pain control patient's blood pressure had normalized to systolics of 140s.  Today, patient is presenting with headache that appears to be generalized.  She is sitting up and is not toxic in appearance but is difficult to communicate with.  Patient is well-dressed well-groomed with make-up and jewelry.  Patient's husband reports that she will always be very attentive to her grooming and appearance every morning as part of normal  routine.  She was able to walk into the emergency department with her husband but he reports he did help support her and hold her arm.  She is not dragging any extremities or having extremities not function.     Past Medical History:  Diagnosis Date   Diabetes mellitus without complication (HCC)    Hyperlipidemia    Hypertension     There are no problems to display for this patient.   Past Surgical History:  Procedure Laterality Date   ANKLE FRACTURE SURGERY     BREAST EXCISIONAL BIOPSY Bilateral      OB History   No obstetric history on file.     Family History  Problem Relation Age of Onset   Heart disease Other    Hyperlipidemia Other    Hypertension Other    Stroke Other    Asthma Other    Breast cancer Sister    Breast cancer Sister     Social History   Tobacco Use   Smoking status: Never    Passive exposure: Never   Smokeless tobacco: Never  Vaping Use   Vaping Use: Never used  Substance Use Topics   Alcohol use: No   Drug use: No    Home Medications Prior to Admission medications   Medication Sig Start Date End Date Taking? Authorizing Provider  amLODipine (NORVASC) 2.5 MG tablet Take 2.5 mg by mouth daily.    [provider]  atorvastatin (LIPITOR) 10 MG tablet Take 10 mg by mouth daily.    [provider]  azithromycin (ZITHROMAX) 250 MG tablet Take 2 po first day and then one po qd x 4 days 11/14/13   Deatra Canter, FNP  CALCIUM PO Take 1 tablet by mouth 2 (two) times daily.    [provider]  Cholecalciferol (VITAMIN D) 2000 UNITS CAPS Take 1 capsule by mouth daily.    [provider]  HYDROcodone-homatropine (HYCODAN) 5-1.5 MG/5ML syrup Take 5 mLs by mouth every 8 (eight) hours as needed for cough. 11/14/13   Deatra Canter, FNP  Multiple Vitamin (MULTIVITAMIN WITH MINERALS) TABS Take 1 tablet by mouth daily.    [provider]  omeprazole (PRILOSEC) 20 MG capsule Take 20 mg by mouth daily.     [provider]  verapamil (CALAN) 120 MG tablet Take 120 mg by mouth once.    [provider]    Allergies    Prednisone, Amoxicillin, and Sulfa antibiotics  Review of Systems   Review of Systems 10 systems reviewed and negative except as per HPI Physical Exam Updated Vital Signs BP (!) 195/64   Pulse 71   Temp 97.7 F (36.5 C)   Resp 14   SpO2 97%   Physical Exam Constitutional:      Comments: Patient sitting in the chair in her triage room.  She is sitting upright.  She is not listing.  She is rocking slightly and putting her hands to her forehead off and on.  Eyes are open.  Well-groomed well-dressed well-nourished.  HENT:     Head: Normocephalic and atraumatic.     Right Ear: Tympanic membrane normal.     Left Ear: Tympanic membrane normal.  Eyes:     Extraocular Movements: Extraocular movements intact.     Conjunctiva/sclera: Conjunctivae normal.     Pupils: Pupils are equal, round, and reactive to light.  Cardiovascular:     Rate and Rhythm: Normal rate and regular rhythm.  Pulmonary:     Effort: Pulmonary effort is normal.     Breath sounds: Normal breath sounds.  Abdominal:     General: There is no distension.     Palpations: Abdomen is soft.     Tenderness: There is no abdominal tenderness. There is no guarding.  Musculoskeletal:        General: No swelling or tenderness. Normal range of motion.     Cervical back: Neck supple.     Right lower leg: No edema.     Left lower leg: No edema.  Skin:    General: Skin is warm and dry.     Findings: No rash.  Neurological:     Comments: Patient sitting upright with normal body tone.  She is rocking slightly.  She answers questions predominantly with saying "I have a headache", "it is bad" "I do not know" "I do not remember".  She is not generating any back-and-forth communication.  She is moving both hands symmetrically to place him on her forehead and back in her lap.  Extraocular motions are  conjugate.  She is not really following commands for motor strength testing but she will hold her legs out as I examined them independently.  No focal motor deficits.  No obvious ataxia.  Psychiatric:     Comments: Anxious and uncomfortable    ED Results / Procedures / Treatments   Labs (all labs ordered are listed, but only abnormal results are displayed) Labs Reviewed  COMPREHENSIVE METABOLIC PANEL - Abnormal; Notable for the following components:  Result Value   Chloride 97 (*)    CO2 17 (*)    Glucose, Bld 170 (*)    Anion gap 21 (*)    All other components within normal limits  LACTIC ACID, PLASMA - Abnormal; Notable for the following components:   Lactic Acid, Venous 2.7 (*)    All other components within normal limits  CBC WITH DIFFERENTIAL/PLATELET - Abnormal; Notable for the following components:   WBC 13.8 (*)    RBC 5.36 (*)    Hemoglobin 15.7 (*)    HCT 46.2 (*)    Neutro Abs 11.8 (*)    All other components within normal limits  PROTIME-INR  MAGNESIUM  PHOSPHORUS  TSH  SEDIMENTATION RATE  LACTIC ACID, PLASMA  URINALYSIS, ROUTINE W REFLEX MICROSCOPIC  RAPID URINE DRUG SCREEN, HOSP PERFORMED  ETHANOL    EKG EKG Interpretation  Date/Time:  Thursday November 25 2020 15:46:16 EDT Ventricular Rate:  81 PR Interval:  110 QRS Duration: 84 QT Interval:  426 QTC Calculation: 494 R Axis:   63 Text Interpretation: Sinus rhythm with short PR Abnormal ECG small inferior q wave, no acute ischemic appearance. no old comparison Confirmed by Arby Barrette [phone]) on 11/25/2020 4:43:06 PM  Radiology CT ANGIO HEAD NECK W WO CM  Result Date: 11/25/2020 CLINICAL DATA:  Altered mental status and headache EXAM: CT ANGIOGRAPHY HEAD AND NECK TECHNIQUE: Multidetector CT imaging of the head and neck was performed using the standard protocol during bolus administration of intravenous contrast. Multiplanar CT image reconstructions and MIPs were obtained to evaluate the  vascular anatomy. Carotid stenosis measurements (when applicable) are obtained utilizing NASCET criteria, using the distal internal carotid diameter as the denominator. CONTRAST:  45mL OMNIPAQUE IOHEXOL 350 MG/ML SOLN COMPARISON:  None. FINDINGS: CT HEAD Brain: There is no acute intracranial hemorrhage, mass effect, or edema. Gray-white differentiation is preserved. There is no extra-axial fluid collection. Ventricles and sulci are within normal limits in size and configuration. Vascular: No hyperdense vessel . Skull: Calvarium is unremarkable. Sinuses/Orbits: No acute finding. Other: None. Review of the MIP images confirms the above findings CTA NECK Aortic arch: Great vessel origins are patent. Right carotid system: Patent. Mixed plaque along the proximal internal carotid causing less than 50% stenosis. Common carotid retropharyngeal course. Left carotid system: Patent. No stenosis. Common carotid retropharyngeal course. Vertebral arteries: Patent and codominant.  No stenosis. Skeleton: Right facet predominant cervical spine degenerative changes. Other neck: Unremarkable. Upper chest: Included upper lungs are clear. Review of the MIP images confirms the above findings CTA HEAD Anterior circulation: Intracranial internal carotid arteries are patent. Anterior and middle cerebral arteries are patent. Posterior circulation: Intracranial vertebral arteries are patent. Basilar artery is patent. Major cerebellar artery origins are patent. Posterior communicating arteries are present bilaterally. Posterior cerebral arteries are patent. Venous sinuses: Patent as allowed by contrast bolus timing. Review of the MIP images confirms the above findings IMPRESSION: No acute intracranial abnormality. No large vessel occlusion, hemodynamically significant stenosis, or evidence of dissection. Plaque at the proximal right ICA causes less than 50% stenosis. Electronically Signed   By: Guadlupe Spanish M.D.   On: 11/25/2020 18:40     Procedures Procedures  CRITICAL CARE Performed by: Arby Barrette   Total critical care time: 30 minutes  Critical care time was exclusive of separately billable procedures and treating other patients.  Critical care was necessary to treat or prevent imminent or life-threatening deterioration.  Critical care was time spent personally by me on the following activities:  development of treatment plan with patient and/or surrogate as well as nursing, discussions with consultants, evaluation of patient's response to treatment, examination of patient, obtaining history from patient or surrogate, ordering and performing treatments and interventions, ordering and review of laboratory studies, ordering and review of radiographic studies, pulse oximetry and re-evaluation of patient's condition.  Medications Ordered in ED Medications  lactated ringers infusion ( Intravenous New Bag/Given 11/25/20 1709)  LORazepam (ATIVAN) injection 1 mg (has no administration in time range)  HYDROmorphone (DILAUDID) injection 1 mg (1 mg Intravenous Given 11/25/20 1708)  ondansetron (ZOFRAN) injection 4 mg (4 mg Intravenous Given 11/25/20 1707)  lactated ringers bolus 500 mL (500 mLs Intravenous New Bag/Given 11/25/20 1932)  iohexol (OMNIPAQUE) 350 MG/ML injection 80 mL (75 mLs Intravenous Contrast Given 11/25/20 1804)    ED Course  I have reviewed the triage vital signs and the nursing notes.  Pertinent labs & imaging results that were available during my care of the patient were reviewed by me and considered in my medical decision making (see chart for details).    MDM Rules/Calculators/A&P                           Consult: Reviewed with Dr. Amada Jupiter neurology.  Advises patient does need MRI and MRV.  Will transfer to [facility] ED and he will also do consult.  At this time will hold on blood pressure management until MRI done to rule out stroke.  Consult: Dr. Cathi Roan accepts for ED to ED  transfer  Patient presents as outlined.  She is exhibiting an encephalopathic form of confusion of uncertain etiology.  Patient is not specifically lethargic.  She only repeats a couple of limited statements.  She does complain of significant headache.  No focal flaccid paralysis.  No evident facial droop.  Reportedly headache has been waxing and waning for a couple of weeks.  She was seen in the emergency department 2 days ago and hypertensive at that time but hypotension resolved.  At this time differential diagnosis includes CVA, PRES, medication induced encephalopathy, possibly infectious although does not appear to be meningitic or finding suggestive of bacterial meningitis.  Currently no evident source of a bacterial infection to suggest empiric antibiotics.  Pain control attempted with Dilaudid and Zofran.  Patient seems more calm but continues to endorse headache.  Mental status is essentially unchanged.  He continues to answer back with "what did you say" or I do not know" or "my headache is bad".  Patient is slow and confused to follow instructions.  She cannot really perform finger-nose.  She will touch her nose but then does not move forward to touch my hand.  No respiratory distress.  No difficulty handling secretions.  Stable for transport by EMS for further diagnostic evaluation with MRI and neurology consultation. Final Clinical Impression(s) / ED Diagnoses Final diagnoses:  Bad headache  Hypertension, unspecified type  Confusion    Rx / DC Orders ED Discharge Orders     None        Arby Barrette, MD 11/25/20 1949

## 2020-11-26 ENCOUNTER — Observation Stay (HOSPITAL_COMMUNITY): Payer: Medicare PPO

## 2020-11-26 DIAGNOSIS — G43109 Migraine with aura, not intractable, without status migrainosus: Secondary | ICD-10-CM | POA: Diagnosis present

## 2020-11-26 DIAGNOSIS — Z880 Allergy status to penicillin: Secondary | ICD-10-CM | POA: Diagnosis not present

## 2020-11-26 DIAGNOSIS — R519 Headache, unspecified: Secondary | ICD-10-CM

## 2020-11-26 DIAGNOSIS — I10 Essential (primary) hypertension: Secondary | ICD-10-CM

## 2020-11-26 DIAGNOSIS — I6501 Occlusion and stenosis of right vertebral artery: Secondary | ICD-10-CM | POA: Diagnosis not present

## 2020-11-26 DIAGNOSIS — I6613 Occlusion and stenosis of bilateral anterior cerebral arteries: Secondary | ICD-10-CM | POA: Diagnosis not present

## 2020-11-26 DIAGNOSIS — I6521 Occlusion and stenosis of right carotid artery: Secondary | ICD-10-CM | POA: Diagnosis present

## 2020-11-26 DIAGNOSIS — I6603 Occlusion and stenosis of bilateral middle cerebral arteries: Secondary | ICD-10-CM | POA: Diagnosis not present

## 2020-11-26 DIAGNOSIS — Z7984 Long term (current) use of oral hypoglycemic drugs: Secondary | ICD-10-CM | POA: Diagnosis not present

## 2020-11-26 DIAGNOSIS — E785 Hyperlipidemia, unspecified: Secondary | ICD-10-CM

## 2020-11-26 DIAGNOSIS — E1110 Type 2 diabetes mellitus with ketoacidosis without coma: Secondary | ICD-10-CM | POA: Diagnosis present

## 2020-11-26 DIAGNOSIS — I160 Hypertensive urgency: Secondary | ICD-10-CM | POA: Diagnosis present

## 2020-11-26 DIAGNOSIS — R4701 Aphasia: Secondary | ICD-10-CM | POA: Diagnosis present

## 2020-11-26 DIAGNOSIS — Z79899 Other long term (current) drug therapy: Secondary | ICD-10-CM | POA: Diagnosis not present

## 2020-11-26 DIAGNOSIS — R4182 Altered mental status, unspecified: Secondary | ICD-10-CM | POA: Diagnosis present

## 2020-11-26 DIAGNOSIS — Z20822 Contact with and (suspected) exposure to covid-19: Secondary | ICD-10-CM | POA: Diagnosis present

## 2020-11-26 DIAGNOSIS — Z882 Allergy status to sulfonamides status: Secondary | ICD-10-CM | POA: Diagnosis not present

## 2020-11-26 DIAGNOSIS — E876 Hypokalemia: Secondary | ICD-10-CM | POA: Diagnosis present

## 2020-11-26 DIAGNOSIS — E860 Dehydration: Secondary | ICD-10-CM | POA: Diagnosis present

## 2020-11-26 DIAGNOSIS — Z8249 Family history of ischemic heart disease and other diseases of the circulatory system: Secondary | ICD-10-CM | POA: Diagnosis not present

## 2020-11-26 DIAGNOSIS — R410 Disorientation, unspecified: Secondary | ICD-10-CM

## 2020-11-26 DIAGNOSIS — K219 Gastro-esophageal reflux disease without esophagitis: Secondary | ICD-10-CM | POA: Diagnosis present

## 2020-11-26 DIAGNOSIS — Z888 Allergy status to other drugs, medicaments and biological substances status: Secondary | ICD-10-CM | POA: Diagnosis not present

## 2020-11-26 DIAGNOSIS — D72829 Elevated white blood cell count, unspecified: Secondary | ICD-10-CM | POA: Diagnosis present

## 2020-11-26 LAB — GLUCOSE, CAPILLARY
Glucose-Capillary: 107 mg/dL — ABNORMAL HIGH (ref 70–99)
Glucose-Capillary: 120 mg/dL — ABNORMAL HIGH (ref 70–99)

## 2020-11-26 LAB — CBG MONITORING, ED
Glucose-Capillary: 105 mg/dL — ABNORMAL HIGH (ref 70–99)
Glucose-Capillary: 111 mg/dL — ABNORMAL HIGH (ref 70–99)
Glucose-Capillary: 112 mg/dL — ABNORMAL HIGH (ref 70–99)
Glucose-Capillary: 115 mg/dL — ABNORMAL HIGH (ref 70–99)
Glucose-Capillary: 126 mg/dL — ABNORMAL HIGH (ref 70–99)
Glucose-Capillary: 137 mg/dL — ABNORMAL HIGH (ref 70–99)
Glucose-Capillary: 172 mg/dL — ABNORMAL HIGH (ref 70–99)
Glucose-Capillary: 173 mg/dL — ABNORMAL HIGH (ref 70–99)

## 2020-11-26 LAB — BASIC METABOLIC PANEL
Anion gap: 11 (ref 5–15)
Anion gap: 14 (ref 5–15)
Anion gap: 8 (ref 5–15)
BUN: 10 mg/dL (ref 8–23)
BUN: 9 mg/dL (ref 8–23)
BUN: 9 mg/dL (ref 8–23)
CO2: 19 mmol/L — ABNORMAL LOW (ref 22–32)
CO2: 17 mmol/L — ABNORMAL LOW (ref 22–32)
CO2: 21 mmol/L — ABNORMAL LOW (ref 22–32)
Calcium: 8.7 mg/dL — ABNORMAL LOW (ref 8.9–10.3)
Calcium: 8.7 mg/dL — ABNORMAL LOW (ref 8.9–10.3)
Calcium: 8.6 mg/dL — ABNORMAL LOW (ref 8.9–10.3)
Chloride: 104 mmol/L (ref 98–111)
Chloride: 102 mmol/L (ref 98–111)
Chloride: 105 mmol/L (ref 98–111)
Creatinine, Ser: 0.62 mg/dL (ref 0.44–1.00)
Creatinine, Ser: 0.59 mg/dL (ref 0.44–1.00)
Creatinine, Ser: 0.57 mg/dL (ref 0.44–1.00)
GFR, Estimated: >60 mL/min (ref >60)
GFR, Estimated: >60 mL/min (ref >60)
GFR, Estimated: >60 mL/min (ref >60)
Glucose, Bld: 148 mg/dL — ABNORMAL HIGH (ref 70–99)
Glucose, Bld: 103 mg/dL — ABNORMAL HIGH (ref 70–99)
Glucose, Bld: 130 mg/dL — ABNORMAL HIGH (ref 70–99)
Potassium: 3.6 mmol/L (ref 3.5–5.1)
Potassium: 3.5 mmol/L (ref 3.5–5.1)
Potassium: 3.3 mmol/L — ABNORMAL LOW (ref 3.5–5.1)
Sodium: 134 mmol/L — ABNORMAL LOW (ref 135–145)
Sodium: 133 mmol/L — ABNORMAL LOW (ref 135–145)
Sodium: 134 mmol/L — ABNORMAL LOW (ref 135–145)

## 2020-11-26 LAB — CBC
HCT: 41 % (ref 36.0–46.0)
Hemoglobin: 13.9 g/dL (ref 12.0–15.0)
MCH: 30.3 pg (ref 26.0–34.0)
MCHC: 33.9 g/dL (ref 30.0–36.0)
MCV: 89.5 fL (ref 80.0–100.0)
Platelets: 316 10*3/uL (ref 150–400)
RBC: 4.58 MIL/uL (ref 3.87–5.11)
RDW: 12.8 % (ref 11.5–15.5)
WBC: 16.1 10*3/uL — ABNORMAL HIGH (ref 4.0–10.5)
nRBC: 0 % (ref 0.0–0.2)

## 2020-11-26 LAB — BETA-HYDROXYBUTYRIC ACID
Beta-Hydroxybutyric Acid: 4.84 mmol/L — ABNORMAL HIGH (ref 0.05–0.27)
Beta-Hydroxybutyric Acid: 1.89 mmol/L — ABNORMAL HIGH (ref 0.05–0.27)
Beta-Hydroxybutyric Acid: 0.76 mmol/L — ABNORMAL HIGH (ref 0.05–0.27)

## 2020-11-26 LAB — HEMOGLOBIN A1C
Hgb A1c MFr Bld: 8.1 % — ABNORMAL HIGH (ref 4.8–5.6)
Mean Plasma Glucose: 185.77 mg/dL

## 2020-11-26 IMAGING — MR MR MRA HEAD W/O CM
1 series · 20 of 48 positions shown · IV contrast (gadavist)
Comparison: CTA of the head neck from yesterday.

CLINICAL DATA: Stroke suspected.

EXAM:
MRI HEAD WITHOUT AND WITH CONTRAST
MRA HEAD WITHOUT CONTRAST
MRA NECK WITHOUT AND WITH CONTRAST
TECHNIQUE: Multiplanar, multiecho pulse sequences of the brain and surrounding
structures were obtained without and with intravenous contrast.
Angiographic images of the Circle of Willis were obtained using MRA
technique without intravenous contrast. Angiographic images of the
neck were obtained using MRA technique without and with intravenous
contrast. Carotid stenosis measurements (when applicable) are
obtained utilizing NASCET criteria, using the distal internal
carotid diameter as the denominator.
CONTRAST:  6mL GADAVIST GADOBUTROL 1 MMOL/ML IV SOLN

[Series 5: 3d cow · axial · 0.5mm · 0.41mm/px · z∈[-63,+15]mm · 20 of 172 slices shown]
[im 1/172]
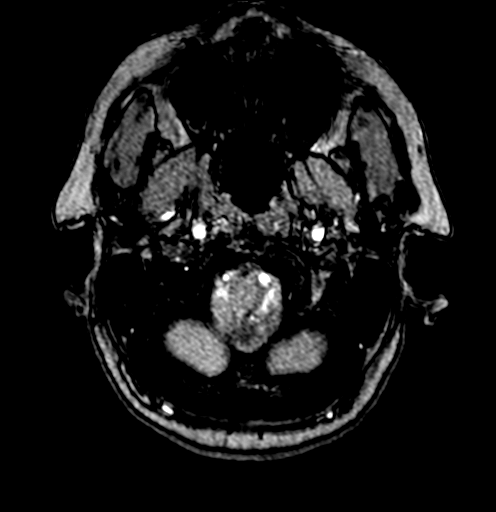
[im 4/172]
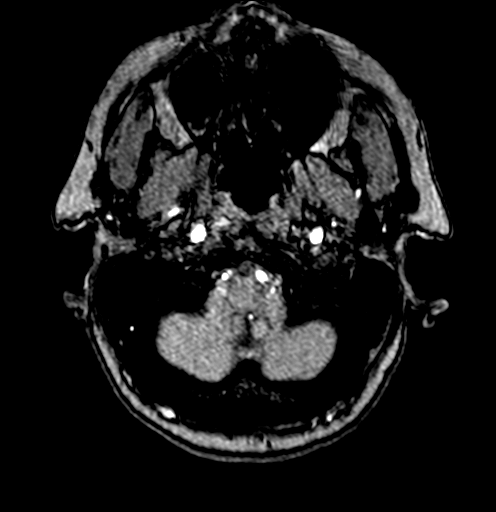
[im 8/172]
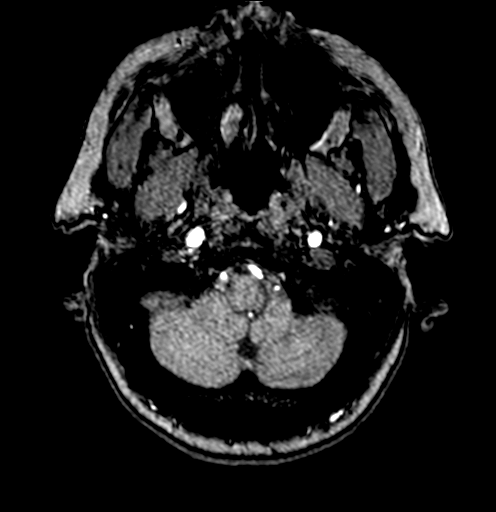
[im 11/172]
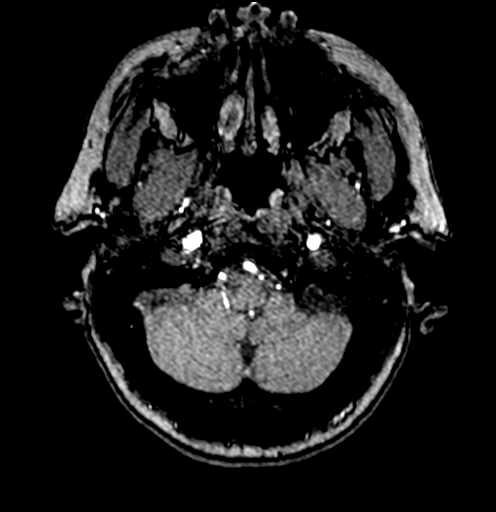
[im 15/172]
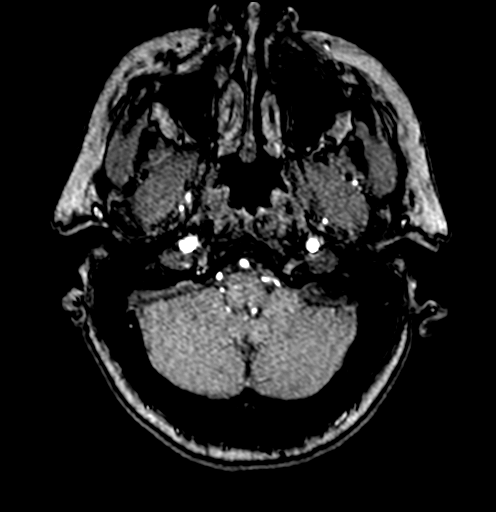
[im 19/172]
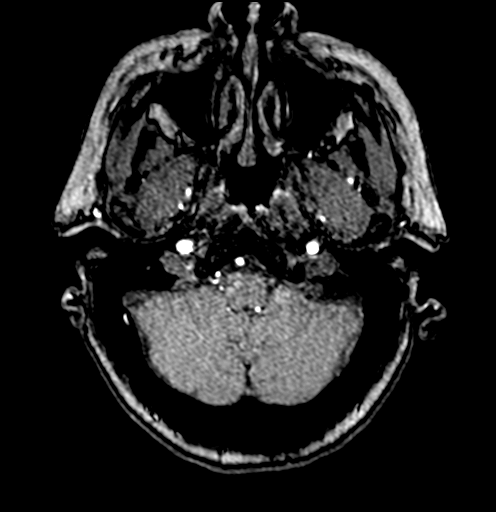
[im 22/172]
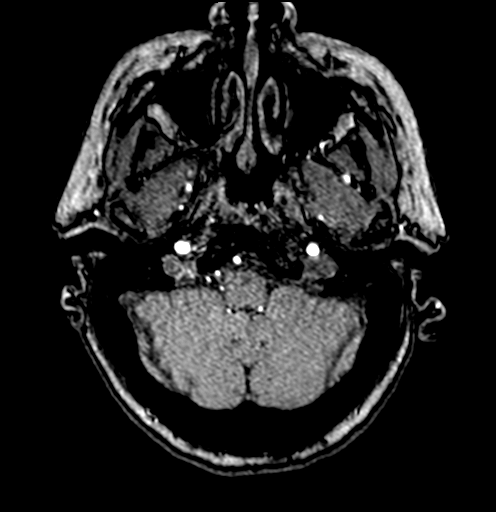
[im 26/172]
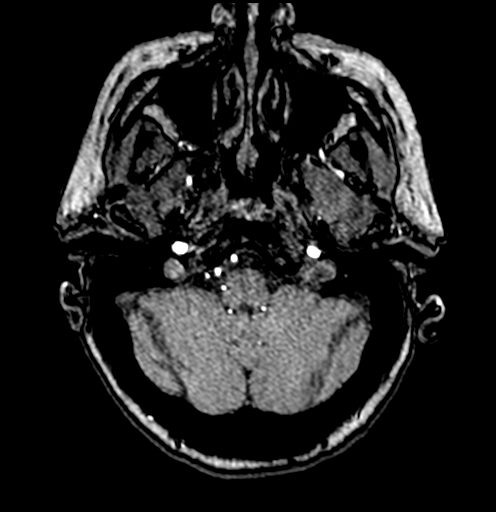
[im 30/172]
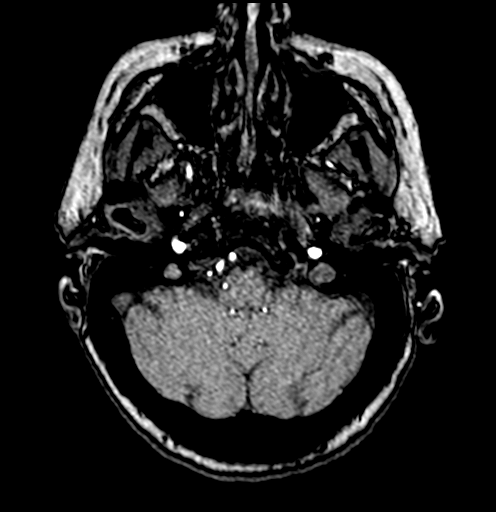
[im 33/172]
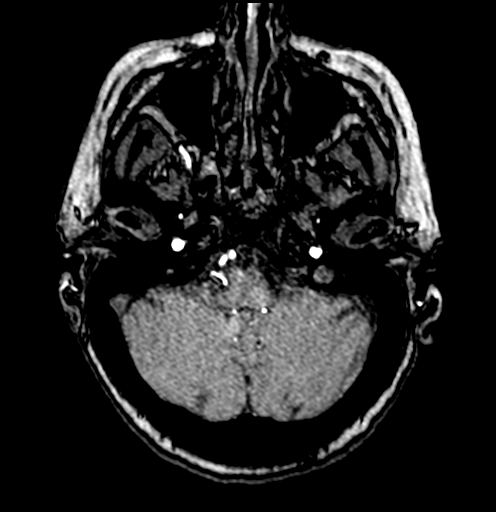
[im 37/172]
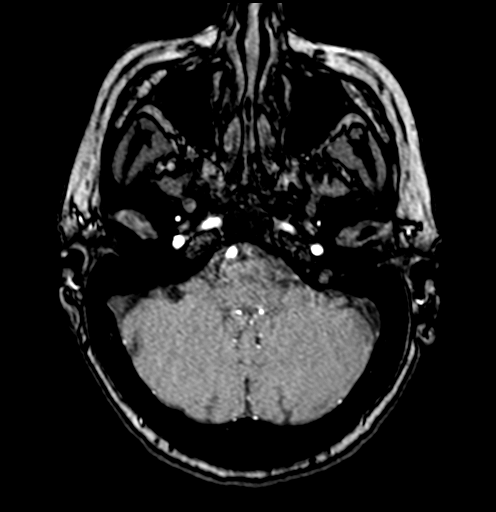
[im 41/172]
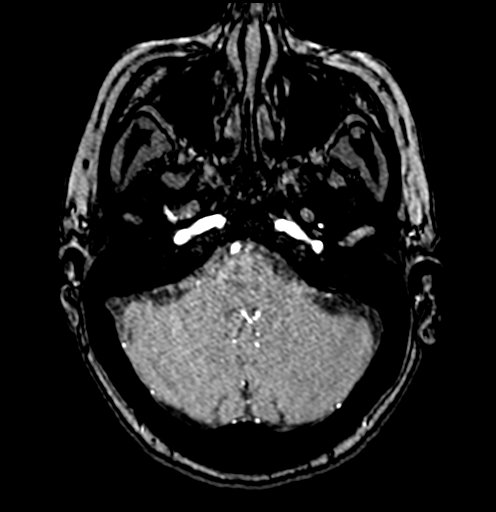
[im 55/172]
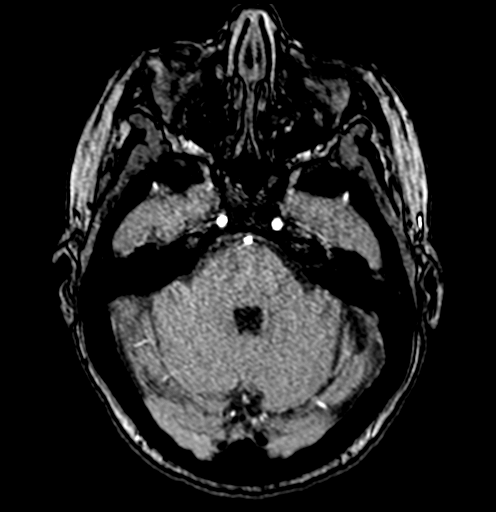
[im 77/172]
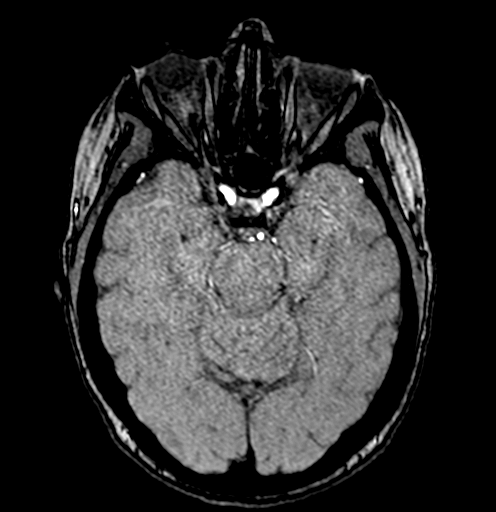
[im 88/172]
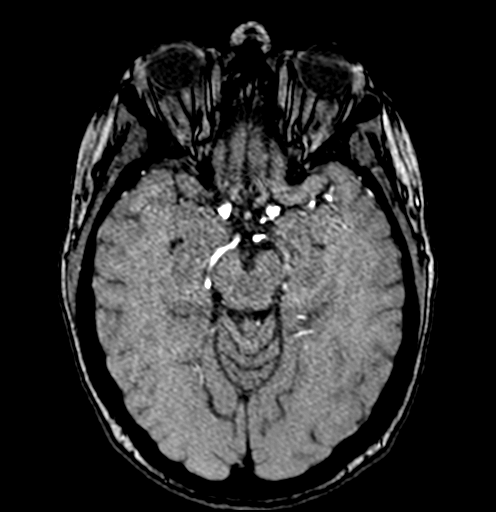
[im 99/172]
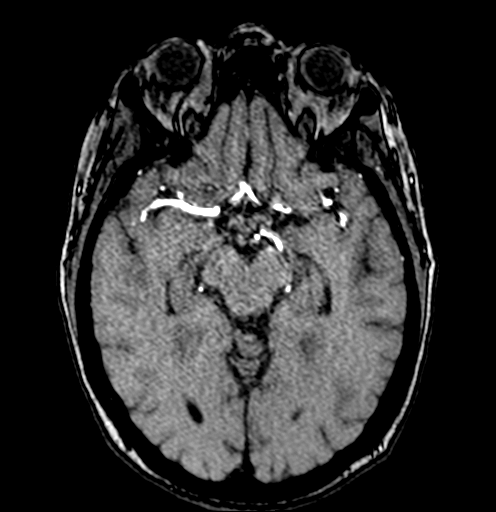
[im 121/172]
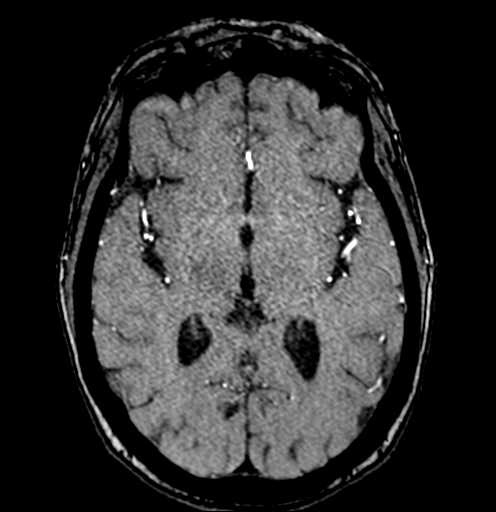
[im 142/172]
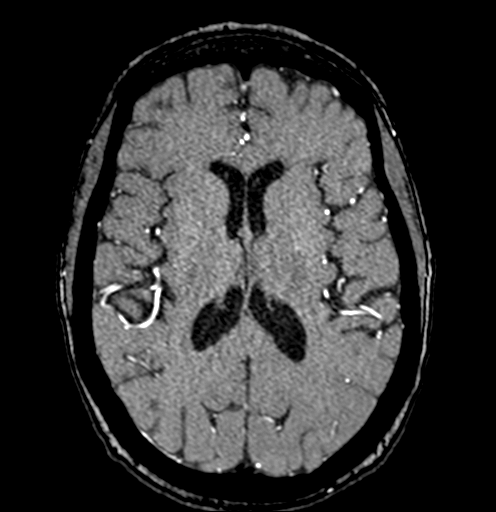
[im 146/172]
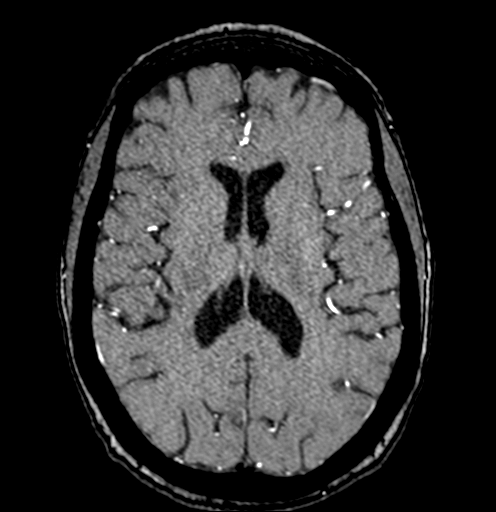
[im 164/172]
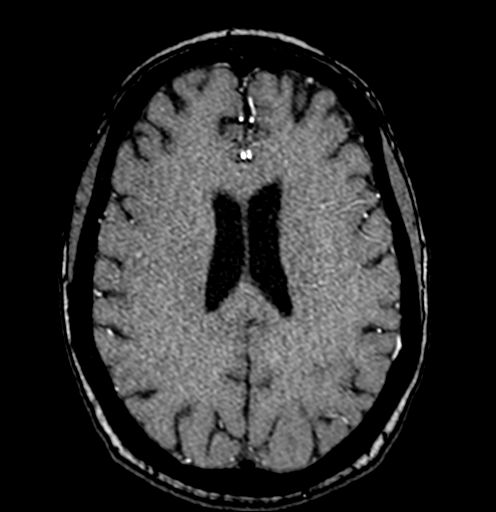

[20 of 48 positions shown; findings below may reference images not displayed]

FINDINGS: MRI HEAD FINDINGS

Brain: No acute infarction, hemorrhage, hydrocephalus, extra-axial
collection or mass lesion. Small FLAIR hyperintensities in the
cerebral white matter attributed to chronic small vessel disease. No
abnormal enhancement.

Vascular: Normal flow voids and vascular enhancements.

Skull and upper cervical spine: Normal marrow signal.

Sinuses/Orbits: Negative.

MRA HEAD FINDINGS

Major vessels are smooth and widely patent. Mild atheromatous
undulation of MCA and ACA branch vessels. Mild to moderate narrowing
of the right V4 segment just beyond the PICA origin. There is also a
mild narrowing of the right PICA. Negative for aneurysm.

MRA NECK FINDINGS

Antegrade flow in the carotid and vertebral arteries.

Unremarkable neck mask. Normal arch with 3 vessel branching. No flow
limiting stenosis, ulceration, or beading in the carotid or
vertebral circulation. There is vessel tortuosity greatest at the
left ICA.
IMPRESSION: Brain MRI:

Unremarkable for age.

Intracranial MRA:

1. No emergent finding or flow limiting stenosis of major vessels.
2. Mild atheromatous irregularity of medium size branches.

Neck MRA:

Negative.

## 2020-11-26 IMAGING — MR MR HEAD WO/W CM
15 of 17 series · 42 of 48 positions shown · IV contrast (gadavist)
Comparison: CTA of the head neck from yesterday.

CLINICAL DATA: Stroke suspected.

EXAM:
MRI HEAD WITHOUT AND WITH CONTRAST
MRA HEAD WITHOUT CONTRAST
MRA NECK WITHOUT AND WITH CONTRAST
TECHNIQUE: Multiplanar, multiecho pulse sequences of the brain and surrounding
structures were obtained without and with intravenous contrast.
Angiographic images of the Circle of Willis were obtained using MRA
technique without intravenous contrast. Angiographic images of the
neck were obtained using MRA technique without and with intravenous
contrast. Carotid stenosis measurements (when applicable) are
obtained utilizing NASCET criteria, using the distal internal
carotid diameter as the denominator.
CONTRAST:  6mL GADAVIST GADOBUTROL 1 MMOL/ML IV SOLN

[Series 5: DWI · axial · 3.0mm · 0.96mm/px · z∈[-79,+74]mm · 6 of 108 slices shown (1 of 4)]
[im 1/108]
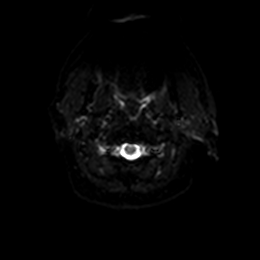
[im 22/108]
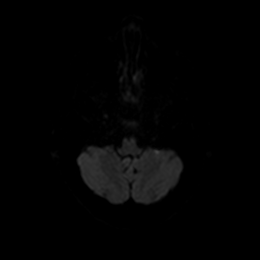
[im 43/108]
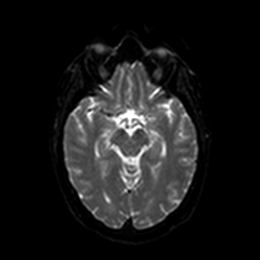
[im 65/108]
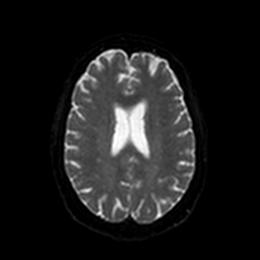
[im 86/108]
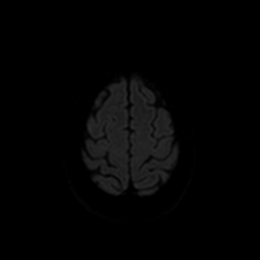
[im 108/108]
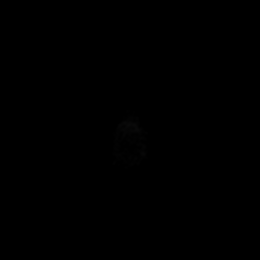

[Series 6: DWI · axial · 3.0mm · 0.96mm/px · z∈[-79,+74]mm · 3 of 53 slices shown (2 of 4)]
[im 1/53]
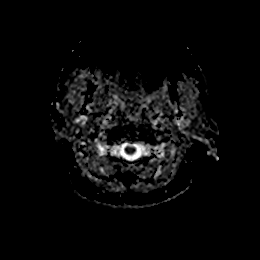
[im 27/53]
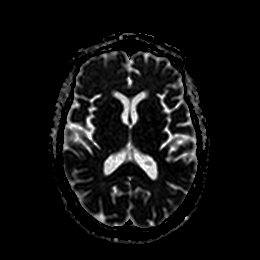
[im 53/53]
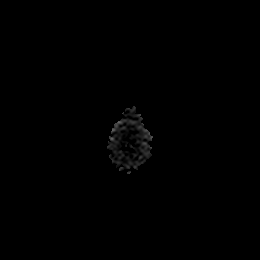

[Series 7: DWI · coronal · 4.0mm · 0.88mm/px · 5 of 82 slices shown (3 of 4)]
[im 1/82]
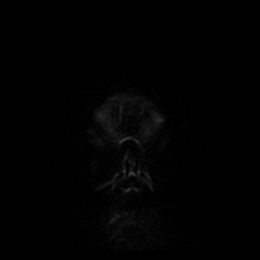
[im 21/82]
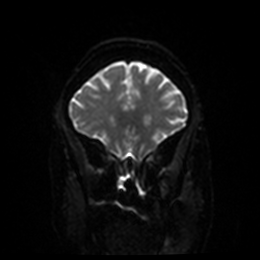
[im 41/82]
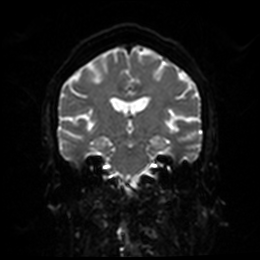
[im 61/82]
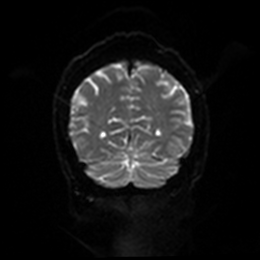
[im 82/82]
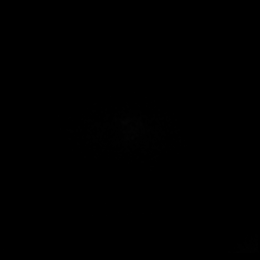

[Series 8: DWI · coronal · 4.0mm · 0.88mm/px · 2 of 40 slices shown (4 of 4)]
[im 1/40]
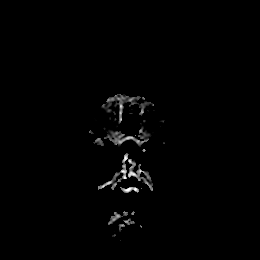
[im 40/40]
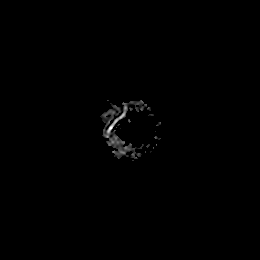

[Series 9: T1 · sagittal · 5.0mm · 0.75mm/px · 2 of 28 slices shown]
[im 1/28]
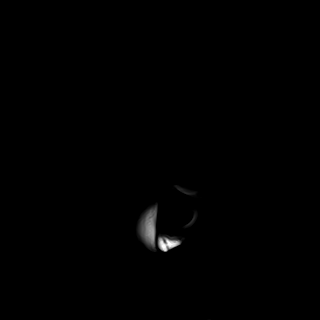
[im 28/28]
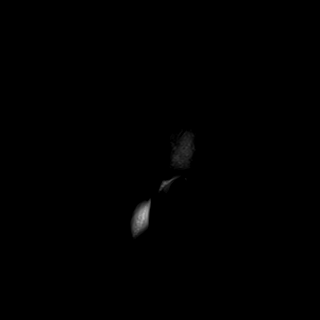

[Series 10: T2 · axial · 5.0mm · 0.78mm/px · z∈[-83,+79]mm · 2 of 29 slices shown (1 of 2)]
[im 1/29]
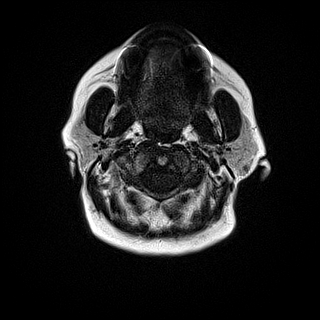
[im 29/29]
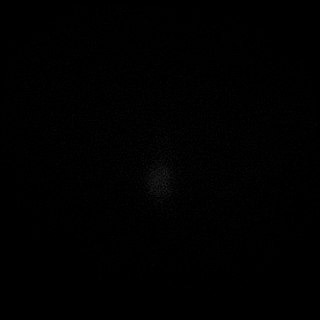

[Series 11: FLAIR · axial · 5.0mm · 0.49mm/px · z∈[-85,+76]mm · 2 of 29 slices shown]
[im 1/29]
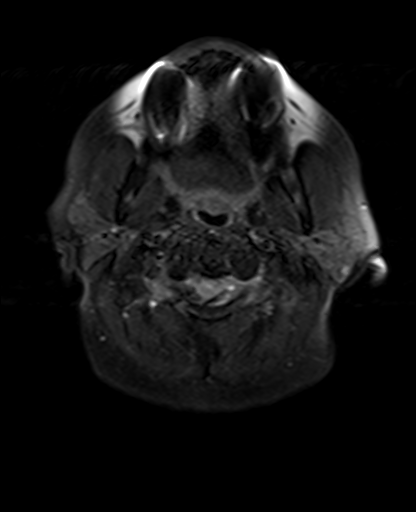
[im 29/29]
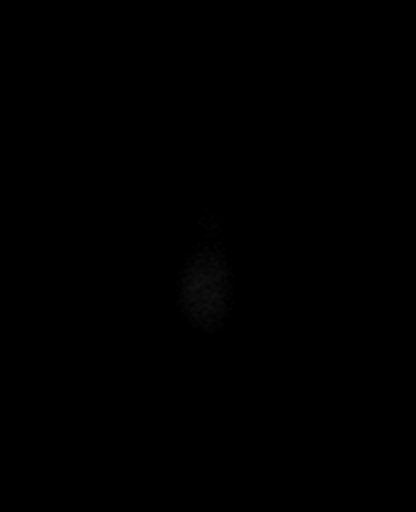

[Series 12: mag_images · axial · 3.0mm · 0.98mm/px · z∈[-84,+75]mm · 3 of 56 slices shown]
[im 1/56]
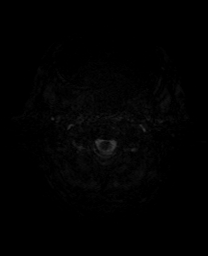
[im 28/56]
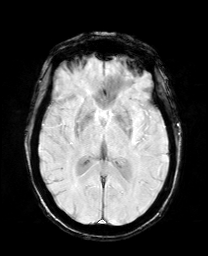
[im 56/56]
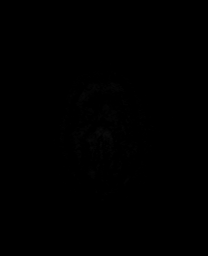

[Series 13: pha_images · axial · 3.0mm · 0.98mm/px · z∈[-84,+75]mm · 3 of 55 slices shown]
[im 1/55]
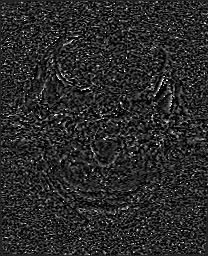
[im 28/55]
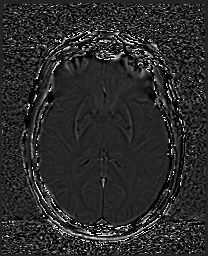
[im 55/55]
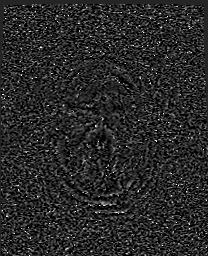

[Series 14: swi_images · axial · 3.0mm · 0.98mm/px · z∈[-84,+75]mm · 3 of 56 slices shown]
[im 1/56]
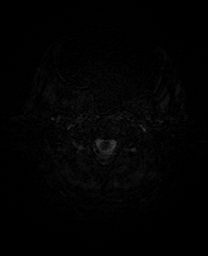
[im 28/56]
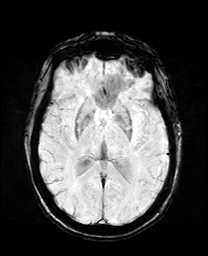
[im 56/56]
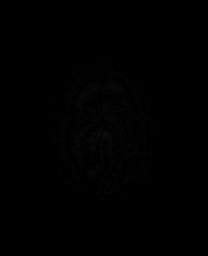

[Series 15: mip_images(sw) · axial · 24.0mm · 0.98mm/px · z∈[-74,+65]mm · 3 of 49 slices shown]
[im 1/49]
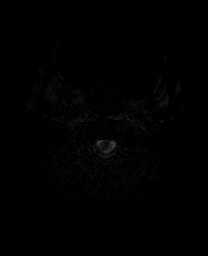
[im 25/49]
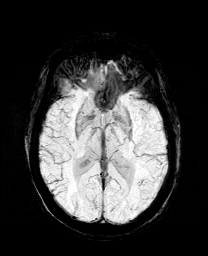
[im 49/49]
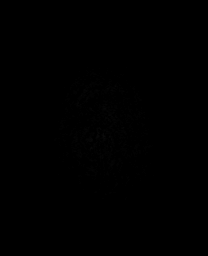

[Series 16: T2 · axial · 5.0mm · 0.75mm/px · z∈[-90,+78]mm · 2 of 30 slices shown (2 of 2)]
[im 1/30]
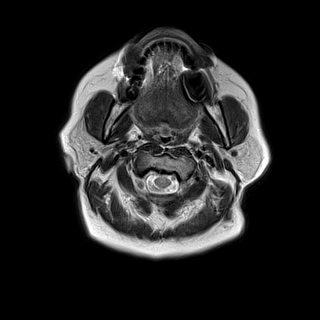
[im 30/30]
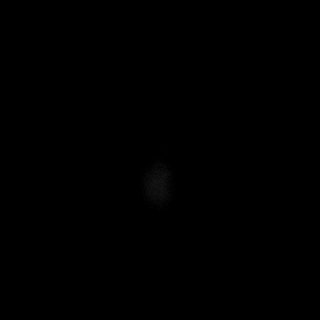

[Series 18: T2 post-contrast · coronal · 5.0mm · 0.72mm/px · 2 of 34 slices shown]
[im 1/34]
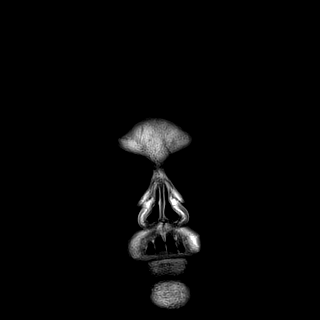
[im 34/34]
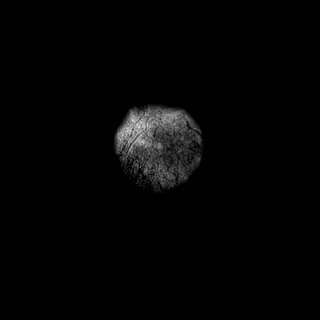

[Series 20: T1 post-contrast · sagittal · 5.0mm · 0.72mm/px · 2 of 28 slices shown (1 of 2)]
[im 1/28]
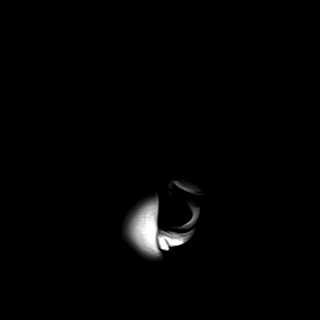
[im 28/28]
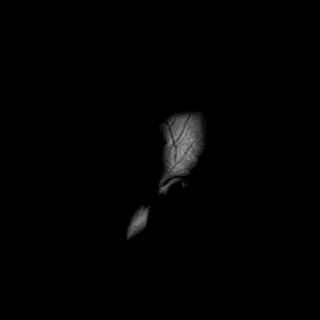

[Series 21: T1 post-contrast · coronal · 5.0mm · 0.34mm/px · 2 of 34 slices shown (2 of 2)]
[im 1/34]
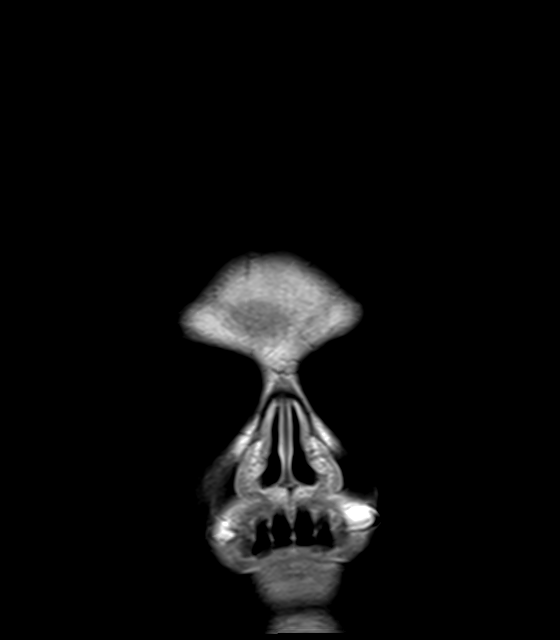
[im 34/34]
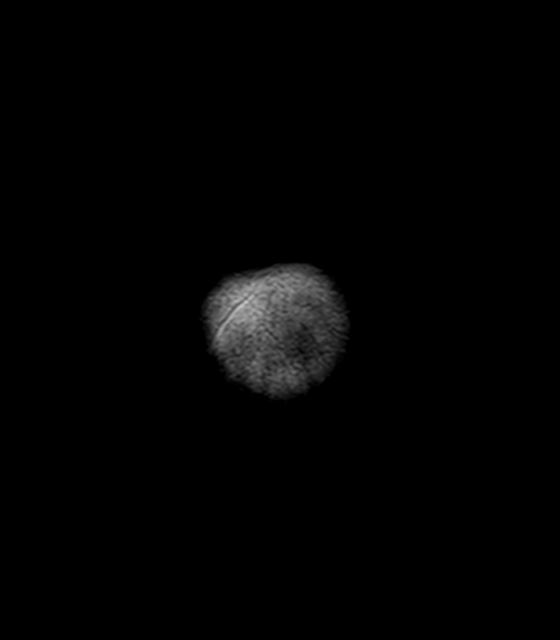

[42 of 48 positions shown; findings below may reference images not displayed]

FINDINGS: MRI HEAD FINDINGS

Brain: No acute infarction, hemorrhage, hydrocephalus, extra-axial
collection or mass lesion. Small FLAIR hyperintensities in the
cerebral white matter attributed to chronic small vessel disease. No
abnormal enhancement.

Vascular: Normal flow voids and vascular enhancements.

Skull and upper cervical spine: Normal marrow signal.

Sinuses/Orbits: Negative.

MRA HEAD FINDINGS

Major vessels are smooth and widely patent. Mild atheromatous
undulation of MCA and ACA branch vessels. Mild to moderate narrowing
of the right V4 segment just beyond the PICA origin. There is also a
mild narrowing of the right PICA. Negative for aneurysm.

MRA NECK FINDINGS

Antegrade flow in the carotid and vertebral arteries.

Unremarkable neck mask. Normal arch with 3 vessel branching. No flow
limiting stenosis, ulceration, or beading in the carotid or
vertebral circulation. There is vessel tortuosity greatest at the
left ICA.
IMPRESSION: Brain MRI:

Unremarkable for age.

Intracranial MRA:

1. No emergent finding or flow limiting stenosis of major vessels.
2. Mild atheromatous irregularity of medium size branches.

Neck MRA:

Negative.

## 2020-11-26 IMAGING — MR MR MRA NECK WO/W CM
6 of 10 series · 39 of 48 positions shown · IV contrast (gadavist)
Comparison: CTA of the head neck from yesterday.

CLINICAL DATA: Stroke suspected.

EXAM:
MRI HEAD WITHOUT AND WITH CONTRAST
MRA HEAD WITHOUT CONTRAST
MRA NECK WITHOUT AND WITH CONTRAST
TECHNIQUE: Multiplanar, multiecho pulse sequences of the brain and surrounding
structures were obtained without and with intravenous contrast.
Angiographic images of the Circle of Willis were obtained using MRA
technique without intravenous contrast. Angiographic images of the
neck were obtained using MRA technique without and with intravenous
contrast. Carotid stenosis measurements (when applicable) are
obtained utilizing NASCET criteria, using the distal internal
carotid diameter as the denominator.
CONTRAST:  6mL GADAVIST GADOBUTROL 1 MMOL/ML IV SOLN

[Series 7: tof_fl3d_tra_iso · axial · 0.6mm · 0.52mm/px · z∈[-147,-69]mm · 10 of 133 slices shown]
[im 1/133]
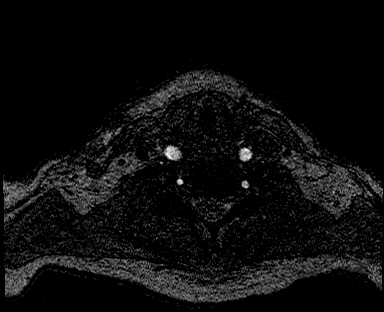
[im 15/133]
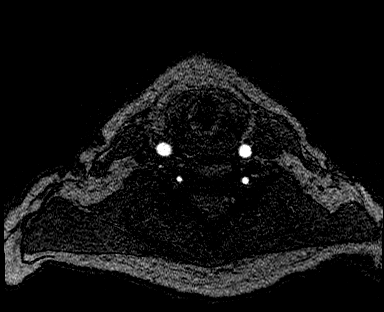
[im 30/133]
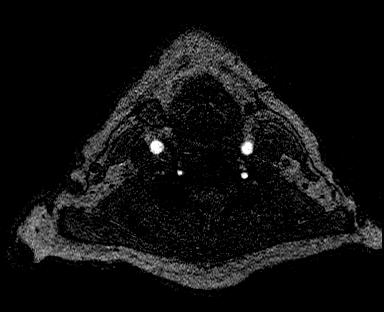
[im 45/133]
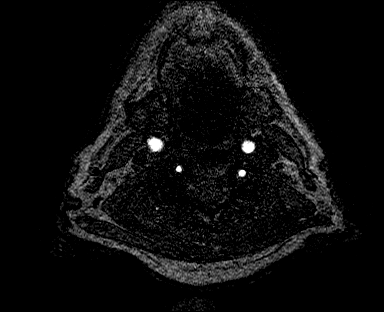
[im 59/133]
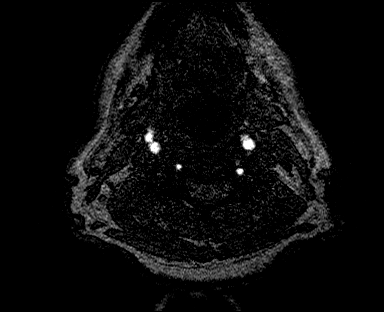
[im 74/133]
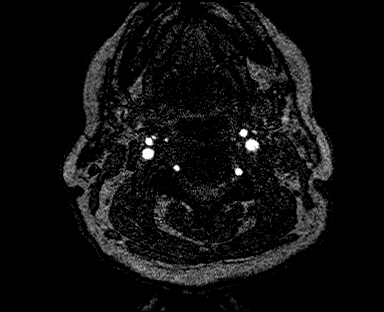
[im 89/133]
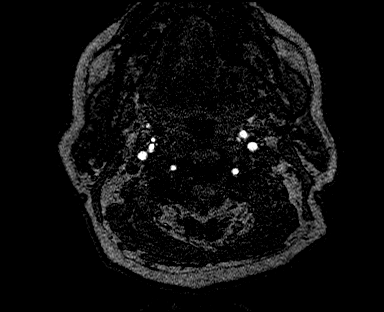
[im 103/133]
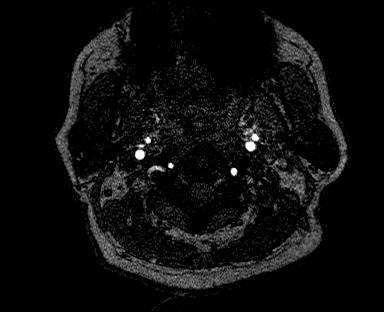
[im 118/133]
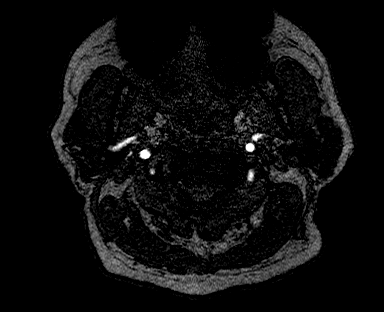
[im 133/133]
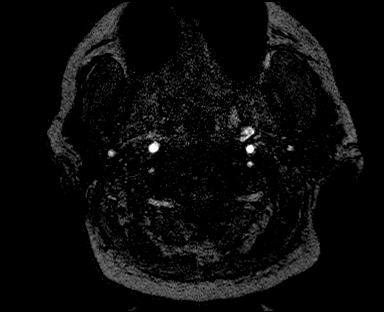

[Series 10: tof_fl2d_paracor · coronal · 2.5mm · 1.05mm/px · 5 of 70 slices shown]
[im 1/70]
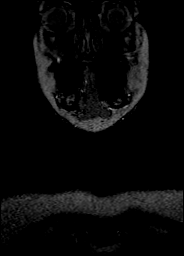
[im 18/70]
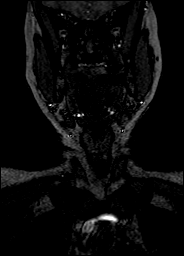
[im 35/70]
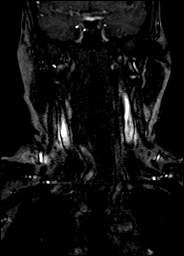
[im 52/70]
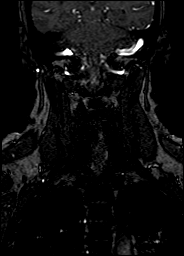
[im 70/70]
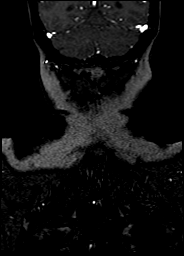

[Series 14: angio_fl3d_cor_pre_ttc=3.0s · coronal · 0.9mm · 0.85mm/px · 6 of 80 slices shown]
[im 1/80]
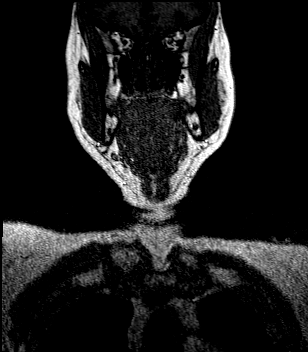
[im 16/80]
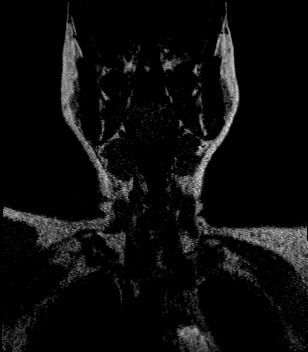
[im 32/80]
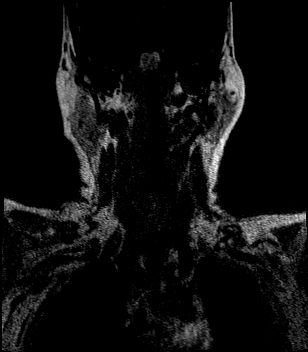
[im 48/80]
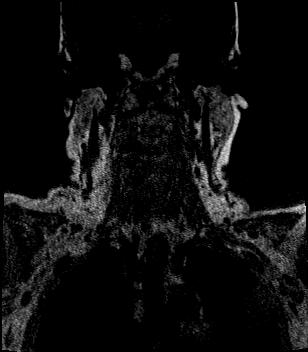
[im 64/80]
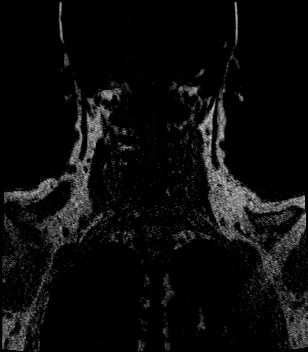
[im 80/80]
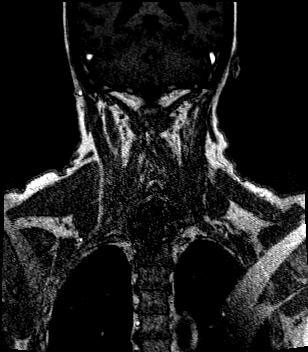

[Series 16: angio_fl3d_cor_post_ttc=3.0s · coronal · 0.9mm · 0.85mm/px · 6 of 80 slices shown]
[im 1/80]
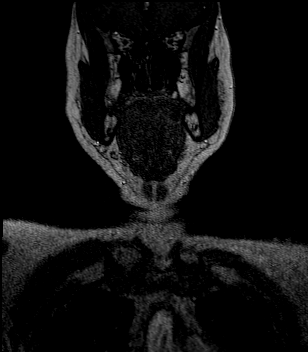
[im 16/80]
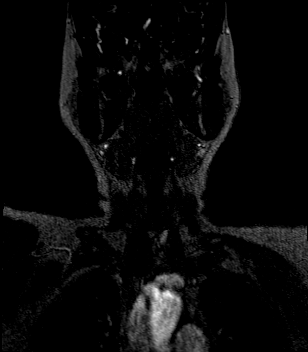
[im 32/80]
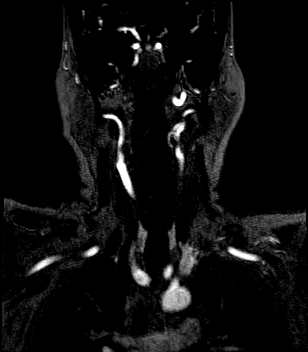
[im 48/80]
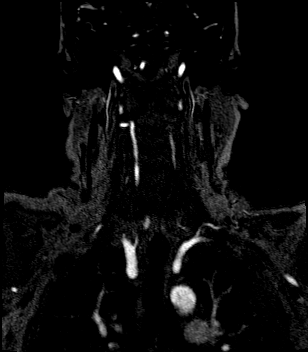
[im 64/80]
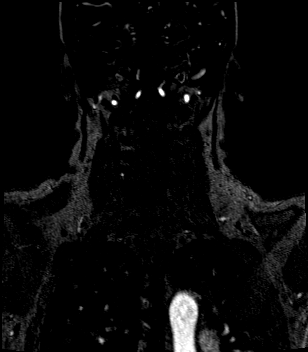
[im 80/80]
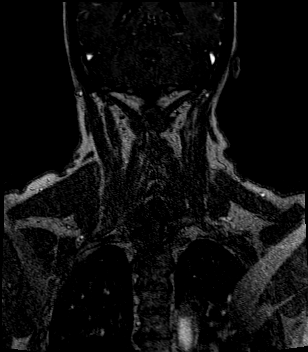

[Series 17: angio_fl3d_cor_post_ttc=3.0s_moco-adv · coronal · 0.9mm · 0.85mm/px · 6 of 80 slices shown]
[im 1/80]
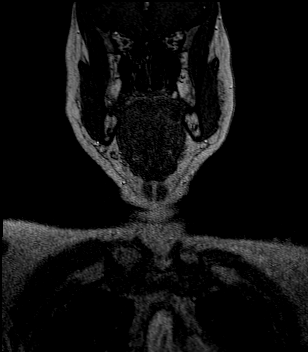
[im 16/80]
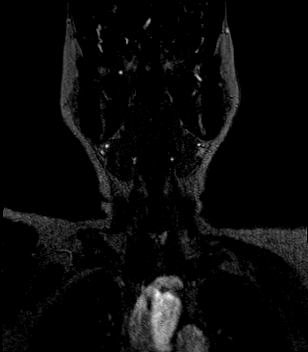
[im 32/80]
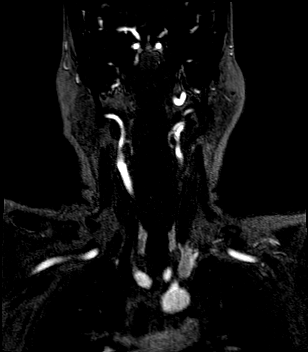
[im 48/80]
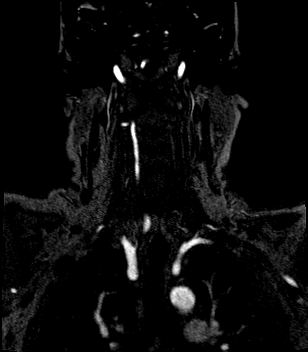
[im 64/80]
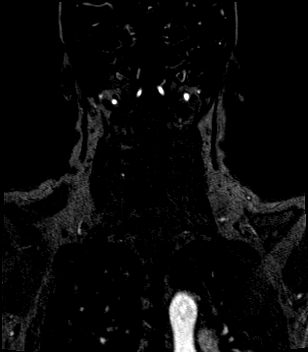
[im 80/80]
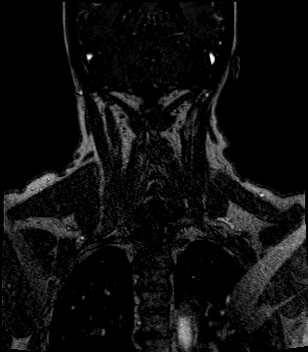

[Series 18: angio_fl3d_cor_post_ttc=3.0s_moco-adv_sub · coronal · 0.9mm · 0.85mm/px · 6 of 80 slices shown]
[im 1/80]
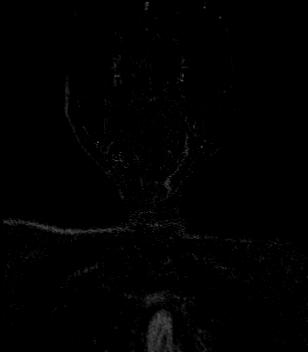
[im 16/80]
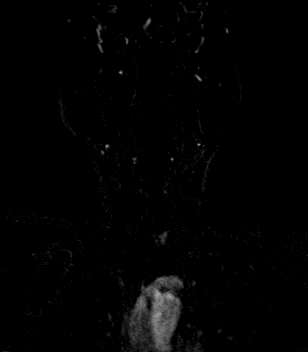
[im 32/80]
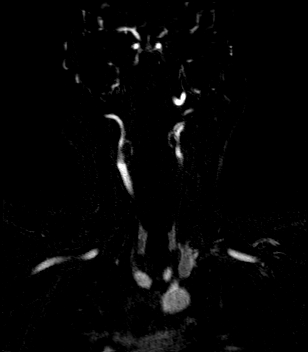
[im 48/80]
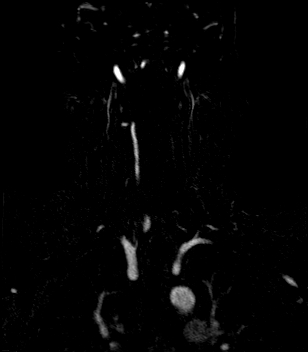
[im 64/80]
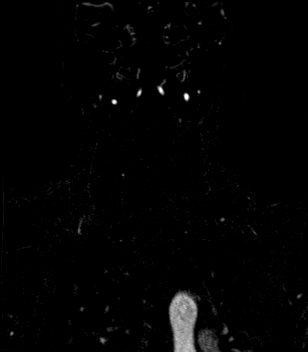
[im 80/80]
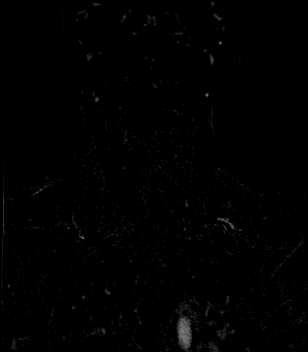

[39 of 48 positions shown; findings below may reference images not displayed]

FINDINGS: MRI HEAD FINDINGS

Brain: No acute infarction, hemorrhage, hydrocephalus, extra-axial
collection or mass lesion. Small FLAIR hyperintensities in the
cerebral white matter attributed to chronic small vessel disease. No
abnormal enhancement.

Vascular: Normal flow voids and vascular enhancements.

Skull and upper cervical spine: Normal marrow signal.

Sinuses/Orbits: Negative.

MRA HEAD FINDINGS

Major vessels are smooth and widely patent. Mild atheromatous
undulation of MCA and ACA branch vessels. Mild to moderate narrowing
of the right V4 segment just beyond the PICA origin. There is also a
mild narrowing of the right PICA. Negative for aneurysm.

MRA NECK FINDINGS

Antegrade flow in the carotid and vertebral arteries.

Unremarkable neck mask. Normal arch with 3 vessel branching. No flow
limiting stenosis, ulceration, or beading in the carotid or
vertebral circulation. There is vessel tortuosity greatest at the
left ICA.
IMPRESSION: Brain MRI:

Unremarkable for age.

Intracranial MRA:

1. No emergent finding or flow limiting stenosis of major vessels.
2. Mild atheromatous irregularity of medium size branches.

Neck MRA:

Negative.

## 2020-11-26 MED ORDER — VERAPAMIL HCL ER 120 MG PO TBCR
120.0000 mg | EXTENDED_RELEASE_TABLET | Freq: Every day | ORAL | Status: DC
  Administered 2020-11-26 – 2020-11-27 (×2): 120 mg via ORAL
  Filled 2020-11-26 (×3): qty 1

## 2020-11-26 MED ORDER — ENOXAPARIN SODIUM 40 MG/0.4ML IJ SOSY
40.0000 mg | PREFILLED_SYRINGE | Freq: Every day | INTRAMUSCULAR | Status: DC
  Administered 2020-11-26 (×2): 40 mg via SUBCUTANEOUS
  Filled 2020-11-26 (×2): qty 0.4

## 2020-11-26 MED ORDER — ESCITALOPRAM OXALATE 10 MG PO TABS
15.0000 mg | ORAL_TABLET | Freq: Every day | ORAL | Status: DC
  Administered 2020-11-26 – 2020-11-27 (×2): 15 mg via ORAL
  Filled 2020-11-26 (×2): qty 2

## 2020-11-26 MED ORDER — ACETAMINOPHEN 650 MG RE SUPP: 650.0000 mg | Freq: Four times a day (QID) | RECTAL | Status: DC | PRN

## 2020-11-26 MED ORDER — INSULIN REGULAR(HUMAN) IN NACL 100-0.9 UT/100ML-% IV SOLN
INTRAVENOUS | Status: DC
  Administered 2020-11-26: 0.5 [IU]/h via INTRAVENOUS

## 2020-11-26 MED ORDER — PANTOPRAZOLE SODIUM 40 MG PO TBEC
40.0000 mg | DELAYED_RELEASE_TABLET | Freq: Every day | ORAL | Status: DC
  Administered 2020-11-26 – 2020-11-27 (×2): 40 mg via ORAL
  Filled 2020-11-26 (×2): qty 1

## 2020-11-26 MED ORDER — ACETAMINOPHEN 325 MG PO TABS
650.0000 mg | ORAL_TABLET | Freq: Four times a day (QID) | ORAL | Status: DC | PRN
  Administered 2020-11-26 – 2020-11-27 (×2): 650 mg via ORAL
  Filled 2020-11-26 (×2): qty 2

## 2020-11-26 MED ORDER — POTASSIUM CHLORIDE CRYS ER 20 MEQ PO TBCR
40.0000 meq | EXTENDED_RELEASE_TABLET | Freq: Once | ORAL | Status: AC
  Administered 2020-11-26: 40 meq via ORAL
  Filled 2020-11-26: qty 2

## 2020-11-26 MED ORDER — HYDRALAZINE HCL 20 MG/ML IJ SOLN: 10.0000 mg | INTRAMUSCULAR | Status: DC | PRN

## 2020-11-26 MED ORDER — HYDRALAZINE HCL 20 MG/ML IJ SOLN: 5.0000 mg | INTRAMUSCULAR | Status: DC | PRN

## 2020-11-26 MED ORDER — INSULIN ASPART 100 UNIT/ML IJ SOLN: 0.0000 [IU] | Freq: Three times a day (TID) | INTRAMUSCULAR | Status: DC

## 2020-11-26 MED ORDER — DIPHENHYDRAMINE HCL 50 MG/ML IJ SOLN
12.5000 mg | Freq: Once | INTRAMUSCULAR | Status: AC
  Administered 2020-11-27: 12.5 mg via INTRAVENOUS
  Filled 2020-11-26: qty 1

## 2020-11-26 MED ORDER — SODIUM CHLORIDE 0.9 % IV SOLN
12.5000 mg | Freq: Four times a day (QID) | INTRAVENOUS | Status: DC | PRN
  Filled 2020-11-26 (×3): qty 0.5

## 2020-11-26 MED ORDER — METOCLOPRAMIDE HCL 5 MG/ML IJ SOLN
10.0000 mg | Freq: Once | INTRAMUSCULAR | Status: AC
  Administered 2020-11-27: 10 mg via INTRAVENOUS
  Filled 2020-11-26: qty 2

## 2020-11-26 MED ORDER — ONDANSETRON HCL 4 MG/2ML IJ SOLN: 4.0000 mg | Freq: Four times a day (QID) | INTRAMUSCULAR | Status: DC | PRN

## 2020-11-26 MED ORDER — INSULIN GLARGINE-YFGN 100 UNIT/ML ~~LOC~~ SOLN
12.0000 [IU] | Freq: Every day | SUBCUTANEOUS | Status: DC
  Administered 2020-11-26: 12 [IU] via SUBCUTANEOUS
  Filled 2020-11-26 (×2): qty 0.12

## 2020-11-26 MED ORDER — LORAZEPAM 2 MG/ML IJ SOLN: 0.5000 mg | Freq: Once | INTRAMUSCULAR | Status: DC | PRN

## 2020-11-26 MED ORDER — GADOBUTROL 1 MMOL/ML IV SOLN
6.0000 mL | Freq: Once | INTRAVENOUS | Status: AC | PRN
  Administered 2020-11-26: 6 mL via INTRAVENOUS

## 2020-11-26 NOTE — ED Notes (Signed)
Provider at bedside

## 2020-11-26 NOTE — ED Notes (Signed)
Pt transported to MRI 

## 2020-11-26 NOTE — Progress Notes (Addendum)
PROGRESS NOTE        PATIENT DETAILS Name: Madison Wilson Age: 75 y.o. Sex: female Date of Birth: 12-07-1945 Admit Date: 11/25/2020 Admitting Physician Shela Leff, MD BBC:[id number], Hal Hope, MD  Brief Narrative: Patient is a 75 y.o. female with history of DM-2, HTN, HLD, GERD-who presented with several days history of headache, confusion, aphasia-patient was subsequently admitted to the hospitalist service for further evaluation and treatment.  Subjective: Lying comfortably in bed-denies any chest pain or shortness of breath.  She is completely awake and alert-spouse at bedside.  Headache is very minimal.  Objective: Vitals: Blood pressure (!) 142/64, pulse 65, temperature 98.7 F (37.1 C), temperature source Oral, resp. rate 16, SpO2 96 %.   Exam: Gen Exam:Alert awake-not in any distress HEENT:atraumatic, normocephalic Chest: B/L clear to auscultation anteriorly CVS:S1S2 regular Abdomen:soft non tender, non distended Extremities:no edema Neurology: Non focal Skin: no rash  Pertinent Labs/Radiology: WBC: 16.1 Na: 134 K: 3.3 Creatinine: 0.57  10/27>> ESR: 3  10/27>> CT angio head/neck: No LVO or hemodynamically significant stenosis.  No evidence of dissection. 10/28>> MRI brain: No acute intracranial abnormality 10/28>> MRA head/neck: No flow-limiting stenosis.  Assessment/Plan: Mild ketoacidosis: Multiple etiologies possible-poor oral intake over the past few days-still could have starvation ketoacidosis-is on oral diabetic medications that could cause ketoacidosis as well.  Bicarb has improved-stopping culprit medications-encouraging oral intake.  follow electrolytes periodically.  DM-2 (A1c 8.1 on 10/28): Stop insulin infusion-start Lantus 12 units and SSI.  Hold SGLT2 inhibitors that can cause euglycemic ketoacidosis.  Headache/altered mental status: Possible that this is complicated migraine.  Continues to have a mild headache this  morning.  Neuroimaging negative.  ESR stable.  If headache reoccurs with more severity-we can try a cocktail of Toradol/Reglan/Benadryl.  Will await further recommendations from neurology.  HTN: BP slowly creeping up-resume verapamil-hold losartan.  Reassess on 10/20  GERD: Continue PPI  Hypokalemia: Replete and recheck.  Procedures: None Consults: None DVT Prophylaxis: Lovenox Code Status:Full code  Family Communication: None at bedside  Time spent: 35 minutes-Greater than 50% of this time was spent in counseling, explanation of diagnosis, planning of further management, and coordination of care.  Diet: Diet Order             Diet heart healthy/carb modified Room service appropriate? Yes; Fluid consistency: Thin  Diet effective now                      Disposition Plan: Status is: Observation  The patient remains OBS appropriate and will d/c before 2 midnights.  Barriers to Discharge: Headache/AMS/ketoacidosis-work-up in progress-needs neurology follow-up-another 24 hours of observation before consideration of discharge.  Antimicrobial agents: Anti-infectives (From admission, onward)    None        MEDICATIONS: Scheduled Meds:  enoxaparin (LOVENOX) injection  40 mg Subcutaneous QHS   insulin aspart  0-9 Units Subcutaneous TID WC   insulin glargine-yfgn  12 Units Subcutaneous Daily   potassium chloride  40 mEq Oral Once   Continuous Infusions:  lactated ringers Stopped (11/25/20 2201)   promethazine (PHENERGAN) injection (IM or IVPB)     PRN Meds:.acetaminophen **OR** acetaminophen, dextrose, hydrALAZINE, LORazepam, promethazine (PHENERGAN) injection (IM or IVPB)   I have personally reviewed following labs and imaging studies  LABORATORY DATA: CBC: Recent Labs  Lab 11/22/20 1029 11/25/20 1619 11/25/20 2229 11/26/20 0534  WBC 5.3 13.8*  --  16.1*  NEUTROABS 3.0 11.8*  --   --   HGB 14.5 15.7* 14.3 13.9  HCT 42.2 46.2* 42.0 41.0  MCV 86.3 86.2   --  89.5  PLT 269 389  --  161    Basic Metabolic Panel: Recent Labs  Lab 11/25/20 1619 11/25/20 2145 11/25/20 2229 11/26/20 0228 11/26/20 0534 11/26/20 0943  NA 135 136 134* 134* 133* 134*  K 3.5 4.0 4.2 3.6 3.5 3.3*  CL 97* 102  --  104 102 105  CO2 17* 17*  --  19* 17* 21*  GLUCOSE 170* 146*  --  148* 103* 130*  BUN 12 11  --  10 9 9   CREATININE 0.60 0.70  --  0.62 0.59 0.57  CALCIUM 9.6 9.2  --  8.7* 8.7* 8.6*  MG 1.8  --   --   --   --   --   PHOS 3.1  --   --   --   --   --     GFR: Estimated Creatinine Clearance: 52.4 mL/min (by C-G formula based on SCr of 0.57 mg/dL).  Liver Function Tests: Recent Labs  Lab 11/22/20 1029 11/25/20 1619  AST 17 24  ALT 19 34  ALKPHOS 46 64  BILITOT 0.5 0.7  PROT 6.3* 7.4  ALBUMIN 4.1 4.7   No results for input(s): LIPASE, AMYLASE in the last 168 hours. No results for input(s): AMMONIA in the last 168 hours.  Coagulation Profile: Recent Labs  Lab 11/25/20 1619  INR 0.9    Cardiac Enzymes: No results for input(s): CKTOTAL, CKMB, CKMBINDEX, TROPONINI in the last 168 hours.  BNP (last 3 results) No results for input(s): PROBNP in the last 8760 hours.  Lipid Profile: No results for input(s): CHOL, HDL, LDLCALC, TRIG, CHOLHDL, LDLDIRECT in the last 72 hours.  Thyroid Function Tests: Recent Labs    11/25/20 1619  TSH 0.730    Anemia Panel: No results for input(s): VITAMINB12, FOLATE, FERRITIN, TIBC, IRON, RETICCTPCT in the last 72 hours.  Urine analysis:    Component Value Date/Time   COLORURINE COLORLESS (A) 11/25/2020 2005   APPEARANCEUR CLEAR 11/25/2020 2005   LABSPEC 1.041 (H) 11/25/2020 2005   PHURINE 5.5 11/25/2020 2005   GLUCOSEU >1,000 (A) 11/25/2020 2005   HGBUR NEGATIVE 11/25/2020 2005   BILIRUBINUR NEGATIVE 11/25/2020 2005   KETONESUR >80 (A) 11/25/2020 2005   PROTEINUR TRACE (A) 11/25/2020 2005   UROBILINOGEN 0.2 12/20/2011 0620   NITRITE NEGATIVE 11/25/2020 2005   LEUKOCYTESUR NEGATIVE  11/25/2020 2005    Sepsis Labs: Lactic Acid, Venous    Component Value Date/Time   LATICACIDVEN 1.6 11/25/2020 1948    MICROBIOLOGY: Recent Results (from the past 240 hour(s))  Resp Panel by RT-PCR (Flu A&B, Covid) Nasopharyngeal Swab     Status: None   Collection Time: 11/25/20  8:15 PM   Specimen: Nasopharyngeal Swab; Nasopharyngeal(NP) swabs in vial transport medium  Result Value Ref Range Status   SARS Coronavirus 2 by RT PCR NEGATIVE NEGATIVE Final    Comment: (NOTE) SARS-CoV-2 target nucleic acids are NOT DETECTED.  The SARS-CoV-2 RNA is generally detectable in upper respiratory specimens during the acute phase of infection. The lowest concentration of SARS-CoV-2 viral copies this assay can detect is 138 copies/mL. A negative result does not preclude SARS-Cov-2 infection and should not be used as the sole basis for treatment or other patient management decisions. A negative result may occur with  improper specimen  collection/handling, submission of specimen other than nasopharyngeal swab, presence of viral mutation(s) within the areas targeted by this assay, and inadequate number of viral copies(<138 copies/mL). A negative result must be combined with clinical observations, patient history, and epidemiological information. The expected result is Negative.  Fact Sheet for Patients:  EntrepreneurPulse.com.au  Fact Sheet for Healthcare Providers:  IncredibleEmployment.be  This test is no t yet approved or cleared by the [place] FDA and  has been authorized for detection and/or diagnosis of SARS-CoV-2 by FDA under an Emergency Use Authorization (EUA). This EUA will remain  in effect (meaning this test can be used) for the duration of the COVID-19 declaration under Section 564(b)(1) of the Act, 21 U.S.C.section 360bbb-3(b)(1), unless the authorization is terminated  or revoked sooner.       Influenza A by PCR NEGATIVE NEGATIVE  Final   Influenza B by PCR NEGATIVE NEGATIVE Final    Comment: (NOTE) The Xpert Xpress SARS-CoV-2/FLU/RSV plus assay is intended as an aid in the diagnosis of influenza from Nasopharyngeal swab specimens and should not be used as a sole basis for treatment. Nasal washings and aspirates are unacceptable for Xpert Xpress SARS-CoV-2/FLU/RSV testing.  Fact Sheet for Patients: EntrepreneurPulse.com.au  Fact Sheet for Healthcare Providers: IncredibleEmployment.be  This test is not yet approved or cleared by the [place] FDA and has been authorized for detection and/or diagnosis of SARS-CoV-2 by FDA under an Emergency Use Authorization (EUA). This EUA will remain in effect (meaning this test can be used) for the duration of the COVID-19 declaration under Section 564(b)(1) of the Act, 21 U.S.C. section 360bbb-3(b)(1), unless the authorization is terminated or revoked.  Performed at [facility], [address] [place], [place] [address]     RADIOLOGY STUDIES/RESULTS: CT ANGIO HEAD NECK W WO CM  Result Date: 11/25/2020 CLINICAL DATA:  Altered mental status and headache EXAM: CT ANGIOGRAPHY HEAD AND NECK TECHNIQUE: Multidetector CT imaging of the head and neck was performed using the standard protocol during bolus administration of intravenous contrast. Multiplanar CT image reconstructions and MIPs were obtained to evaluate the vascular anatomy. Carotid stenosis measurements (when applicable) are obtained utilizing NASCET criteria, using the distal internal carotid diameter as the denominator. CONTRAST:  31m OMNIPAQUE IOHEXOL 350 MG/ML SOLN COMPARISON:  None. FINDINGS: CT HEAD Brain: There is no acute intracranial hemorrhage, mass effect, or edema. Gray-white differentiation is preserved. There is no extra-axial fluid collection. Ventricles and sulci are within normal limits in size and configuration. Vascular: No hyperdense vessel .  Skull: Calvarium is unremarkable. Sinuses/Orbits: No acute finding. Other: None. Review of the MIP images confirms the above findings CTA NECK Aortic arch: Great vessel origins are patent. Right carotid system: Patent. Mixed plaque along the proximal internal carotid causing less than 50% stenosis. Common carotid retropharyngeal course. Left carotid system: Patent. No stenosis. Common carotid retropharyngeal course. Vertebral arteries: Patent and codominant.  No stenosis. Skeleton: Right facet predominant cervical spine degenerative changes. Other neck: Unremarkable. Upper chest: Included upper lungs are clear. Review of the MIP images confirms the above findings CTA HEAD Anterior circulation: Intracranial internal carotid arteries are patent. Anterior and middle cerebral arteries are patent. Posterior circulation: Intracranial vertebral arteries are patent. Basilar artery is patent. Major cerebellar artery origins are patent. Posterior communicating arteries are present bilaterally. Posterior cerebral arteries are patent. Venous sinuses: Patent as allowed by contrast bolus timing. Review of the MIP images confirms the above findings IMPRESSION: No acute intracranial abnormality. No large vessel occlusion, hemodynamically significant stenosis,  or evidence of dissection. Plaque at the proximal right ICA causes less than 50% stenosis. Electronically Signed   By: Macy Mis M.D.   On: 11/25/2020 18:40   MR ANGIO HEAD WO CONTRAST  Result Date: 11/26/2020 CLINICAL DATA:  Stroke suspected. EXAM: MRI HEAD WITHOUT AND WITH CONTRAST MRA HEAD WITHOUT CONTRAST MRA NECK WITHOUT AND WITH CONTRAST TECHNIQUE: Multiplanar, multiecho pulse sequences of the brain and surrounding structures were obtained without and with intravenous contrast. Angiographic images of the Circle of Willis were obtained using MRA technique without intravenous contrast. Angiographic images of the neck were obtained using MRA technique without and  with intravenous contrast. Carotid stenosis measurements (when applicable) are obtained utilizing NASCET criteria, using the distal internal carotid diameter as the denominator. CONTRAST:  12m GADAVIST GADOBUTROL 1 MMOL/ML IV SOLN COMPARISON:  CTA of the head neck from yesterday. FINDINGS: MRI HEAD FINDINGS Brain: No acute infarction, hemorrhage, hydrocephalus, extra-axial collection or mass lesion. Small FLAIR hyperintensities in the cerebral white matter attributed to chronic small vessel disease. No abnormal enhancement. Vascular: Normal flow voids and vascular enhancements. Skull and upper cervical spine: Normal marrow signal. Sinuses/Orbits: Negative. MRA HEAD FINDINGS Major vessels are smooth and widely patent. Mild atheromatous undulation of MCA and ACA branch vessels. Mild to moderate narrowing of the right V4 segment just beyond the PICA origin. There is also a mild narrowing of the right PICA. Negative for aneurysm. MRA NECK FINDINGS Antegrade flow in the carotid and vertebral arteries. Unremarkable neck mask. Normal arch with 3 vessel branching. No flow limiting stenosis, ulceration, or beading in the carotid or vertebral circulation. There is vessel tortuosity greatest at the left ICA. IMPRESSION: Brain MRI: Unremarkable for age. Intracranial MRA: 1. No emergent finding or flow limiting stenosis of major vessels. 2. Mild atheromatous irregularity of medium size branches. Neck MRA: Negative. Electronically Signed   By: JJorje GuildM.D.   On: 11/26/2020 06:53   MR ANGIO NECK W WO CONTRAST  Result Date: 11/26/2020 CLINICAL DATA:  Stroke suspected. EXAM: MRI HEAD WITHOUT AND WITH CONTRAST MRA HEAD WITHOUT CONTRAST MRA NECK WITHOUT AND WITH CONTRAST TECHNIQUE: Multiplanar, multiecho pulse sequences of the brain and surrounding structures were obtained without and with intravenous contrast. Angiographic images of the Circle of Willis were obtained using MRA technique without intravenous contrast.  Angiographic images of the neck were obtained using MRA technique without and with intravenous contrast. Carotid stenosis measurements (when applicable) are obtained utilizing NASCET criteria, using the distal internal carotid diameter as the denominator. CONTRAST:  671mGADAVIST GADOBUTROL 1 MMOL/ML IV SOLN COMPARISON:  CTA of the head neck from yesterday. FINDINGS: MRI HEAD FINDINGS Brain: No acute infarction, hemorrhage, hydrocephalus, extra-axial collection or mass lesion. Small FLAIR hyperintensities in the cerebral white matter attributed to chronic small vessel disease. No abnormal enhancement. Vascular: Normal flow voids and vascular enhancements. Skull and upper cervical spine: Normal marrow signal. Sinuses/Orbits: Negative. MRA HEAD FINDINGS Major vessels are smooth and widely patent. Mild atheromatous undulation of MCA and ACA branch vessels. Mild to moderate narrowing of the right V4 segment just beyond the PICA origin. There is also a mild narrowing of the right PICA. Negative for aneurysm. MRA NECK FINDINGS Antegrade flow in the carotid and vertebral arteries. Unremarkable neck mask. Normal arch with 3 vessel branching. No flow limiting stenosis, ulceration, or beading in the carotid or vertebral circulation. There is vessel tortuosity greatest at the left ICA. IMPRESSION: Brain MRI: Unremarkable for age. Intracranial MRA: 1. No emergent finding or flow  limiting stenosis of major vessels. 2. Mild atheromatous irregularity of medium size branches. Neck MRA: Negative. Electronically Signed   By: Jorje Guild M.D.   On: 11/26/2020 06:53   MR BRAIN W WO CONTRAST  Result Date: 11/26/2020 CLINICAL DATA:  Stroke suspected. EXAM: MRI HEAD WITHOUT AND WITH CONTRAST MRA HEAD WITHOUT CONTRAST MRA NECK WITHOUT AND WITH CONTRAST TECHNIQUE: Multiplanar, multiecho pulse sequences of the brain and surrounding structures were obtained without and with intravenous contrast. Angiographic images of the Circle of  Willis were obtained using MRA technique without intravenous contrast. Angiographic images of the neck were obtained using MRA technique without and with intravenous contrast. Carotid stenosis measurements (when applicable) are obtained utilizing NASCET criteria, using the distal internal carotid diameter as the denominator. CONTRAST:  37m GADAVIST GADOBUTROL 1 MMOL/ML IV SOLN COMPARISON:  CTA of the head neck from yesterday. FINDINGS: MRI HEAD FINDINGS Brain: No acute infarction, hemorrhage, hydrocephalus, extra-axial collection or mass lesion. Small FLAIR hyperintensities in the cerebral white matter attributed to chronic small vessel disease. No abnormal enhancement. Vascular: Normal flow voids and vascular enhancements. Skull and upper cervical spine: Normal marrow signal. Sinuses/Orbits: Negative. MRA HEAD FINDINGS Major vessels are smooth and widely patent. Mild atheromatous undulation of MCA and ACA branch vessels. Mild to moderate narrowing of the right V4 segment just beyond the PICA origin. There is also a mild narrowing of the right PICA. Negative for aneurysm. MRA NECK FINDINGS Antegrade flow in the carotid and vertebral arteries. Unremarkable neck mask. Normal arch with 3 vessel branching. No flow limiting stenosis, ulceration, or beading in the carotid or vertebral circulation. There is vessel tortuosity greatest at the left ICA. IMPRESSION: Brain MRI: Unremarkable for age. Intracranial MRA: 1. No emergent finding or flow limiting stenosis of major vessels. 2. Mild atheromatous irregularity of medium size branches. Neck MRA: Negative. Electronically Signed   By: JJorje GuildM.D.   On: 11/26/2020 06:53     LOS: 0 days   SOren Binet MD  [facility]    To contact the attending provider between 7A-7P or the covering provider during after hours 7P-7A, please log into the web site [email or web address] and access using universal [place] password for that web site. If you do not have the  password, please call the hospital operator.  11/26/2020, 12:29 PM

## 2020-11-26 NOTE — Progress Notes (Signed)
EEG complete - results pending 
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
## 2020-11-26 NOTE — Procedures (Signed)
Patient Name: Madison Wilson  MRN: [id number]  Epilepsy Attending: Charlsie Quest  Referring Physician/Provider: Jimmye Norman, NP Date: 11/26/2020 Duration: 23.26 mins  Patient history: 75 year old female with aphasia in the setting of euglycemic DKA.  EEG to evaluate for seizure.  Level of alertness: Awake, asleep  AEDs during EEG study: Ativan  Technical aspects: This EEG study was done with scalp electrodes positioned according to the 10-20 International system of electrode placement. Electrical activity was acquired at a sampling rate of 500Hz  and reviewed with a high frequency filter of 70Hz  and a low frequency filter of 1Hz . EEG data were recorded continuously and digitally stored.   Description: The posterior dominant rhythm consists of 8Hz  activity of moderate voltage (25-35 uV) seen predominantly in posterior head regions, symmetric and reactive to eye opening and eye closing. Sleep was characterized by vertex waves, sleep spindles (12 to 14 Hz), maximal frontocentral region. Hyperventilation and photic stimulation were not performed.     IMPRESSION: This study is within normal limits. No seizures or epileptiform discharges were seen throughout the recording.  Shizuko Wojdyla 

## 2020-11-26 NOTE — Consult Note (Signed)
Neurology Consultation Reason for Consult: Aphasia Referring Physician: Theodis Sato  CC: Aphasia  History is obtained from: Patient, chart review  HPI: Madison Wilson is a 75 y.o. female with a history of hypertension, hyperlipidemia, diabetes who presents with aphasia.  She presented on the 24th with headache gradually worsening over couple weeks.  She had systolics over 889V and was concerned that was contributing she went to the emergency department on the 24th and had some improvement with migraine cocktail.  She then went home.  Today, her husband noticed that she was confused and therefore they presented to the emergency department for further evaluation.   LKW: 10/26 tpa given?: no, outside of window   ROS: Unable to obtain due to altered mental status.   Past Medical History:  Diagnosis Date   Diabetes mellitus without complication ([place])    Hyperlipidemia    Hypertension      Family History  Problem Relation Age of Onset   Heart disease Other    Hyperlipidemia Other    Hypertension Other    Stroke Other    Asthma Other    Breast cancer Sister    Breast cancer Sister      Social History:  reports that she has never smoked. She has never been exposed to tobacco smoke. She has never used smokeless tobacco. She reports that she does not drink alcohol and does not use drugs.   Exam: Current vital signs: BP (!) 125/43   Pulse 72   Temp 97.7 F (36.5 C)   Resp 15   SpO2 95%  Vital signs in last 24 hours: Temp:  [97.7 F (36.5 C)] 97.7 F (36.5 C) (10/27 1539) Pulse Rate:  [67-78] 72 (10/28 0045) Resp:  [12-22] 15 (10/28 0045) BP: (125-212)/(43-86) 125/43 (10/28 0045) SpO2:  [95 %-100 %] 95 % (10/28 0045)   Physical Exam  Constitutional: Appears well-developed and well-nourished.  Psych: Affect appropriate to situation Eyes: No scleral injection HENT: No OP obstruction MSK: no joint deformities.  Cardiovascular: Normal rate and regular rhythm.   Respiratory: Effort normal, non-labored breathing GI: Soft.  No distension. There is no tenderness.  Skin: WDI  Neuro: Mental Status: Patient is awake, alert, she has a mixed expressive and receptive aphasia, is able to give one-word answers, but not always appropriately and sometimes perseverates.  She is able to follow most commands but not all. Cranial Nerves: II: Visual Fields are full. Pupils are equal, round, and reactive to light.   III,IV, VI: EOMI without ptosis or diploplia.  V: Facial sensation is symmetric to temperature VII: Facial movement is symmetric.  VIII: hearing is intact to voice X: Uvula elevates symmetrically XI: Shoulder shrug is symmetric. XII: tongue is midline without atrophy or fasciculations.  Motor: Tone is normal. Bulk is normal. 5/5 strength was present in all four extremities.  Sensory: Sensation is symmetric to light touch and temperature in the arms and legs. Cerebellar: FNF  intact bilaterally   I have reviewed labs in epic and the results pertinent to this consultation are: Sodium 134 Creatinine 0.60 Beta hydroxybutyrate 4.84 ESR 3  I have reviewed the images obtained:CT head - negative  Impression: 75 year old female with aphasia in the setting of euglycemic DKA. She was not acidotic on previous admission, but I wonder about dehydration contributing to her headache.   Recommendations: 1) MRI brain, MRV head 2) EEG 3) MRA head and neck 4) correction of metabolic stresses per IM 5) ammonia 6) neurology will follow.  Roland Rack, MD [facility] [phone]  If 7pm- 7am, please page neurology on call as listed in [place].

## 2020-11-26 NOTE — H&P (Signed)
History and Physical    Madison Wilson IOX:[id number] DOB: 1945/05/26 DOA: 11/25/2020  PCP: Carol Ada, MD Patient coming from: [facility]  Chief Complaint: AMS  HPI: Madison Wilson is a 75 y.o. female with medical history significant of type II diabetes on Tresiba and Jardiance per care everywhere records, hypertension, hyperlipidemia, GERD presented to [facility] ED for evaluation of headache and confusion.  Blood pressure significantly elevated at 201/80 on arrival.  Labs showing WBC 13.8, hemoglobin 15.7, platelet count 389k.  Sodium 135, potassium 3.5, chloride 97, bicarb 17, anion gap 21, BUN 12, creatinine 0.6, glucose 170.  Lactic acid 2.7 > 1.6 with IV fluids.  TSH normal.  ESR normal.  Blood ethanol level undetectable.  UA with evidence of glucosuria and ketonuria; no signs of infection.  Beta hydroxybutyric acid elevated at 4.84.  ABG with pH 7.47.  UDS negative.  COVID and influenza PCR negative.  CTA head and neck negative for acute intracranial abnormality, LVO, hemodynamically significant stenosis, or evidence of dissection.  Showing plaque at the proximal right ICA causing less than 50% stenosis. ED physician discussed the case with Dr. Leonel Ramsay with neurology who recommended obtaining brain MRI and MRV head, will see the patient in consultation once at [facility]  Patient was given Dilaudid, Zofran, and started on IV insulin and IV fluids per DKA protocol.  Also given IV potassium supplementation.  Repeat BMP with glucose 146, bicarb 17, anion gap 17. Transferred to [facility] ED.  Of note, patient was seen in the ED 4 days ago for headache and uncontrolled hypertension with systolics greater than 268.  Head CT was normal at that time.  History limited as patient is confused and very slow to respond to questions.  Reports persistent frontal headaches, 3-4 out of 10 intensity for several weeks associated with photophobia, nausea, and vomiting.  Denies change in vision.   Denies history of headaches in the past.  Also her blood pressure has been running high for the past few weeks since the onset of headaches.  Patient does not remember which medications she takes at home for high blood pressure and diabetes.  She has no other complaints.  Denies cough, shortness of breath, chest pain, abdominal pain, or diarrhea.  No additional history could be obtained from her.  Review of Systems:  All systems reviewed and apart from history of presenting illness, are negative.  Past Medical History:  Diagnosis Date   Diabetes mellitus without complication ([place])    Hyperlipidemia    Hypertension     Past Surgical History:  Procedure Laterality Date   ANKLE FRACTURE SURGERY     BREAST EXCISIONAL BIOPSY Bilateral      reports that she has never smoked. She has never been exposed to tobacco smoke. She has never used smokeless tobacco. She reports that she does not drink alcohol and does not use drugs.  Allergies  Allergen Reactions   Prednisone Hives and Swelling   Amoxicillin Rash   Sulfa Antibiotics Rash    Family History  Problem Relation Age of Onset   Heart disease Other    Hyperlipidemia Other    Hypertension Other    Stroke Other    Asthma Other    Breast cancer Sister    Breast cancer Sister     Prior to Admission medications   Medication Sig Start Date End Date Taking? Authorizing Provider  amLODipine (NORVASC) 2.5 MG tablet Take 2.5 mg by mouth daily.    [provider]  atorvastatin (LIPITOR) 10 MG tablet Take 10 mg by mouth daily.    [provider]  azithromycin (ZITHROMAX) 250 MG tablet Take 2 po first day and then one po qd x 4 days 11/14/13   Lysbeth Penner, FNP  CALCIUM PO Take 1 tablet by mouth 2 (two) times daily.    [provider]  Cholecalciferol (VITAMIN D) 2000 UNITS CAPS Take 1 capsule by mouth daily.    [provider]  HYDROcodone-homatropine (HYCODAN) 5-1.5 MG/5ML syrup Take 5 mLs by mouth  every 8 (eight) hours as needed for cough. 11/14/13   Lysbeth Penner, FNP  Multiple Vitamin (MULTIVITAMIN WITH MINERALS) TABS Take 1 tablet by mouth daily.    [provider]  omeprazole (PRILOSEC) 20 MG capsule Take 20 mg by mouth daily.    [provider]  verapamil (CALAN) 120 MG tablet Take 120 mg by mouth once.    [provider]    Physical Exam: Vitals:   11/25/20 2200 11/25/20 2215 11/25/20 2230 11/26/20 0045  BP: (!) 174/67 (!) 183/64 (!) 181/66 (!) 125/43  Pulse: 72 78 73 72  Resp: _0 Temp:      SpO2: 95% 96% 96% 95%    Physical Exam Constitutional:      General: She is not in acute distress. HENT:     Head: Normocephalic and atraumatic.  Eyes:     Extraocular Movements: Extraocular movements intact.     Conjunctiva/sclera: Conjunctivae normal.  Cardiovascular:     Rate and Rhythm: Normal rate and regular rhythm.     Pulses: Normal pulses.  Pulmonary:     Effort: Pulmonary effort is normal. No respiratory distress.     Breath sounds: Normal breath sounds. No wheezing or rales.  Abdominal:     General: Bowel sounds are normal. There is no distension.     Palpations: Abdomen is soft.     Tenderness: There is no abdominal tenderness.  Musculoskeletal:        General: No swelling or tenderness.     Cervical back: Normal range of motion and neck supple.  Skin:    General: Skin is warm and dry.  Neurological:     General: No focal deficit present.     Mental Status: She is alert.     Cranial Nerves: No cranial nerve deficit.     Sensory: No sensory deficit.     Motor: No weakness.     Comments: Confused and very slow to respond to questions She knows her name and knows it is October 2022.  Does not know where she is.     Labs on Admission: I have personally reviewed following labs and imaging studies  CBC: Recent Labs  Lab 11/22/20 1029 11/25/20 1619 11/25/20 2229  WBC 5.3 13.8*  --   NEUTROABS 3.0 11.8*  --   HGB  14.5 15.7* 14.3  HCT 42.2 46.2* 42.0  MCV 86.3 86.2  --   PLT 269 389  --    Basic Metabolic Panel: Recent Labs  Lab 11/22/20 1029 11/25/20 1619 11/25/20 2145 11/25/20 2229  NA 139 135 136 134*  K 3.1* 3.5 4.0 4.2  CL 106 97* 102  --   CO2 23 17* 17*  --   GLUCOSE 179* 170* 146*  --   BUN _1 --   CREATININE 0.48 0.60 0.70  --   CALCIUM 9.2 9.6 9.2  --   MG  --  1.8  --   --   PHOS  --  3.1  --   --    GFR: Estimated Creatinine Clearance: 52.4 mL/min (by C-G formula based on SCr of 0.7 mg/dL). Liver Function Tests: Recent Labs  Lab 11/22/20 1029 11/25/20 1619  AST 17 24  ALT 19 34  ALKPHOS 46 64  BILITOT 0.5 0.7  PROT 6.3* 7.4  ALBUMIN 4.1 4.7   No results for input(s): LIPASE, AMYLASE in the last 168 hours. No results for input(s): AMMONIA in the last 168 hours. Coagulation Profile: Recent Labs  Lab 11/25/20 1619  INR 0.9   Cardiac Enzymes: No results for input(s): CKTOTAL, CKMB, CKMBINDEX, TROPONINI in the last 168 hours. BNP (last 3 results) No results for input(s): PROBNP in the last 8760 hours. HbA1C: No results for input(s): HGBA1C in the last 72 hours. CBG: Recent Labs  Lab 11/25/20 2141 11/26/20 0007 11/26/20 0116  GLUCAP 135* 173* 137*   Lipid Profile: No results for input(s): CHOL, HDL, LDLCALC, TRIG, CHOLHDL, LDLDIRECT in the last 72 hours. Thyroid Function Tests: Recent Labs    11/25/20 1619  TSH 0.730   Anemia Panel: No results for input(s): VITAMINB12, FOLATE, FERRITIN, TIBC, IRON, RETICCTPCT in the last 72 hours. Urine analysis:    Component Value Date/Time   COLORURINE COLORLESS (A) 11/25/2020 2005   APPEARANCEUR CLEAR 11/25/2020 2005   LABSPEC 1.041 (H) 11/25/2020 2005   PHURINE 5.5 11/25/2020 2005   GLUCOSEU >1,000 (A) 11/25/2020 2005   HGBUR NEGATIVE 11/25/2020 2005   BILIRUBINUR NEGATIVE 11/25/2020 2005   KETONESUR >80 (A) 11/25/2020 2005   PROTEINUR TRACE (A) 11/25/2020 2005   UROBILINOGEN 0.2 12/20/2011 0620    NITRITE NEGATIVE 11/25/2020 2005   LEUKOCYTESUR NEGATIVE 11/25/2020 2005    Radiological Exams on Admission: CT ANGIO HEAD NECK W WO CM  Result Date: 11/25/2020 CLINICAL DATA:  Altered mental status and headache EXAM: CT ANGIOGRAPHY HEAD AND NECK TECHNIQUE: Multidetector CT imaging of the head and neck was performed using the standard protocol during bolus administration of intravenous contrast. Multiplanar CT image reconstructions and MIPs were obtained to evaluate the vascular anatomy. Carotid stenosis measurements (when applicable) are obtained utilizing NASCET criteria, using the distal internal carotid diameter as the denominator. CONTRAST:  69m OMNIPAQUE IOHEXOL 350 MG/ML SOLN COMPARISON:  None. FINDINGS: CT HEAD Brain: There is no acute intracranial hemorrhage, mass effect, or edema. Gray-white differentiation is preserved. There is no extra-axial fluid collection. Ventricles and sulci are within normal limits in size and configuration. Vascular: No hyperdense vessel . Skull: Calvarium is unremarkable. Sinuses/Orbits: No acute finding. Other: None. Review of the MIP images confirms the above findings CTA NECK Aortic arch: Great vessel origins are patent. Right carotid system: Patent. Mixed plaque along the proximal internal carotid causing less than 50% stenosis. Common carotid retropharyngeal course. Left carotid system: Patent. No stenosis. Common carotid retropharyngeal course. Vertebral arteries: Patent and codominant.  No stenosis. Skeleton: Right facet predominant cervical spine degenerative changes. Other neck: Unremarkable. Upper chest: Included upper lungs are clear. Review of the MIP images confirms the above findings CTA HEAD Anterior circulation: Intracranial internal carotid arteries are patent. Anterior and middle cerebral arteries are patent. Posterior circulation: Intracranial vertebral arteries are patent. Basilar artery is patent. Major cerebellar artery origins are patent.  Posterior communicating arteries are present bilaterally. Posterior cerebral arteries are patent. Venous sinuses: Patent as allowed by contrast bolus timing. Review of the MIP images confirms the above findings IMPRESSION: No acute intracranial abnormality. No large  vessel occlusion, hemodynamically significant stenosis, or evidence of dissection. Plaque at the proximal right ICA causes less than 50% stenosis. Electronically Signed   By: Macy Mis M.D.   On: 11/25/2020 18:40    EKG: Independently reviewed.  Sinus rhythm with shortened PR interval, small Q waves in inferior leads.  No acute ischemic changes.  No prior tracing for comparison.  Assessment/Plan Principal Problem:   DKA (diabetic ketoacidosis) ([place]) Active Problems:   Headache   Confusion   Hypertensive urgency   HLD (hyperlipidemia)   Euglycemic DKA Insulin-dependent type 2 diabetes Euglycemic DKA likely due to SGLT2 inhibitor (on Jardiance). Blood glucose 170 on initial labs with bicarb 17 and anion gap 21.  UA with >80 ketones and beta hydroxybutyric acid elevated at 4.84.  ABG with pH 7.47. -Continue IV insulin and IV fluids per DKA protocol.  Monitor BMP every 4 hours and beta hydroxybutyric acid level every 8 hours.  Check A1c.  Keep n.p.o. After DKA resolves, initiate diet and subcutaneous insulin.  Continue IV insulin for an additional 1 to 2 hours to ensure adequate plasma glucose levels.  Headaches and confusion Etiology unclear at this time.  TSH normal.  ESR normal.  Blood ethanol level undetectable.UDS negative.  Head CT done 4 days ago was normal.  Repeat imaging done at [facility] - CTA head and neck negative for acute intracranial abnormality, LVO, hemodynamically significant stenosis, or evidence of dissection. Showing plaque at the proximal right ICA causing less than 50% stenosis.  Differentials include stroke versus press versus cerebral venous thrombosis.  Patient is currently confused and very slow to respond to  questions.  No focal motor or sensory deficit. -Brain MRI and MRV head pending.  Neurology consulted.  Hypertensive urgency Blood pressure elevated with systolic above 379 on arrival.  Now improved with systolic in the 024O to 973Z. -IV hydralazine prn SBP >180.  Resume home meds after pharmacy med rec is done.  Mild lactic acidosis (resolved) Likely due to dehydration.  Mild leukocytosis likely due to hemoconcentration as hemoglobin and platelet count also elevated from baseline.  Not febrile.  Hyperlipidemia GERD -Pharmacy med rec pending.  DVT prophylaxis: Lovenox Code Status: Full code Family Communication: No family available at this time. Disposition Plan: Status is: Observation  The patient remains OBS appropriate and will d/c before 2 midnights.  Level of care: Level of care: Progressive  The medical decision making on this patient was of high complexity and the patient is at high risk for clinical deterioration, therefore this is a level 3 visit.  Shela Leff MD [facility]  If 7PM-7AM, please contact night-coverage [email or web address]  11/26/2020, 1:30 AM

## 2020-11-27 DIAGNOSIS — I10 Essential (primary) hypertension: Secondary | ICD-10-CM | POA: Diagnosis not present

## 2020-11-27 DIAGNOSIS — R519 Headache, unspecified: Secondary | ICD-10-CM | POA: Diagnosis not present

## 2020-11-27 DIAGNOSIS — E1110 Type 2 diabetes mellitus with ketoacidosis without coma: Secondary | ICD-10-CM | POA: Diagnosis not present

## 2020-11-27 LAB — BASIC METABOLIC PANEL
Anion gap: 8 (ref 5–15)
BUN: 15 mg/dL (ref 8–23)
CO2: 23 mmol/L (ref 22–32)
Calcium: 8.6 mg/dL — ABNORMAL LOW (ref 8.9–10.3)
Chloride: 107 mmol/L (ref 98–111)
Creatinine, Ser: 0.61 mg/dL (ref 0.44–1.00)
GFR, Estimated: >60 mL/min (ref >60)
Glucose, Bld: 71 mg/dL (ref 70–99)
Potassium: 3.3 mmol/L — ABNORMAL LOW (ref 3.5–5.1)
Sodium: 138 mmol/L (ref 135–145)

## 2020-11-27 LAB — CBC
HCT: 41 % (ref 36.0–46.0)
Hemoglobin: 13.4 g/dL (ref 12.0–15.0)
MCH: 29.6 pg (ref 26.0–34.0)
MCHC: 32.7 g/dL (ref 30.0–36.0)
MCV: 90.5 fL (ref 80.0–100.0)
Platelets: 274 10*3/uL (ref 150–400)
RBC: 4.53 MIL/uL (ref 3.87–5.11)
RDW: 13 % (ref 11.5–15.5)
WBC: 10 10*3/uL (ref 4.0–10.5)
nRBC: 0 % (ref 0.0–0.2)

## 2020-11-27 LAB — GLUCOSE, CAPILLARY: Glucose-Capillary: 96 mg/dL (ref 70–99)

## 2020-11-27 MED ORDER — METFORMIN HCL 500 MG PO TABS: 500.0000 mg | ORAL_TABLET | Freq: Every day | ORAL | 1 refills | Status: AC

## 2020-11-27 MED ORDER — ATORVASTATIN CALCIUM 10 MG PO TABS
20.0000 mg | ORAL_TABLET | Freq: Every day | ORAL | Status: DC
  Administered 2020-11-27: 20 mg via ORAL
  Filled 2020-11-27: qty 2

## 2020-11-27 MED ORDER — LOSARTAN POTASSIUM 50 MG PO TABS
100.0000 mg | ORAL_TABLET | Freq: Every day | ORAL | Status: DC
  Administered 2020-11-27: 100 mg via ORAL
  Filled 2020-11-27: qty 2

## 2020-11-27 MED ORDER — POTASSIUM CHLORIDE CRYS ER 20 MEQ PO TBCR
40.0000 meq | EXTENDED_RELEASE_TABLET | Freq: Once | ORAL | Status: AC
  Administered 2020-11-27: 40 meq via ORAL
  Filled 2020-11-27: qty 2

## 2020-11-27 MED ORDER — ROPINIROLE HCL 1 MG PO TABS
2.0000 mg | ORAL_TABLET | Freq: Every day | ORAL | Status: DC
  Administered 2020-11-27: 2 mg via ORAL
  Filled 2020-11-27: qty 2

## 2020-11-27 MED ORDER — VERAPAMIL HCL ER 180 MG PO TBCR: 180.0000 mg | EXTENDED_RELEASE_TABLET | Freq: Every day | ORAL | 1 refills | Status: AC

## 2020-11-27 NOTE — Plan of Care (Signed)

## 2020-11-27 NOTE — Progress Notes (Signed)
11/27/2020 Patient was nauseated and c/o feeling chilly at 1222 pm.  Dr Jerral Ralph was made aware and patient vitals was as followed. 176/58 Map 92, temp. 98.9, HR 65, RR 18, Saturation 98 RA. Per MD monitor patient for one hour and if she is going well she can be discharge. RN checked on patient at 1340 and she stated she was feeling better. Patient was discharge from at 1420. Lovie Macadamia RN

## 2020-11-27 NOTE — Discharge Summary (Signed)
PATIENT DETAILS Name: Madison Wilson Age: 75 y.o. Sex: female Date of Birth: 1945-06-22 MRN: [id number]. Admitting Physician: Jonetta Osgood, MD JAS:NKNLZ, Hal Hope, MD  Admit Date: 11/25/2020 Discharge date: 11/27/2020  Recommendations for Outpatient Follow-up:  Follow up with PCP in 1-2 weeks Please obtain CMP/CBC in one week Please ensure follow-up with neurology  Admitted From:  Home  Disposition: Jansen: No  Equipment/Devices: None  Discharge Condition: Stable  CODE STATUS: FULL CODE  Diet recommendation:  Diet Order             Diet - low sodium heart healthy           Diet Carb Modified           Diet heart healthy/carb modified Room service appropriate? Yes; Fluid consistency: Thin  Diet effective now                    Brief Summary: Patient is a 75 y.o. female with history of DM-2, HTN, HLD, GERD-who presented with several days history of headache, confusion, aphasia-patient was subsequently admitted to the hospitalist service for further evaluation and treatment.    Pertinent Labs/Radiology: 10/27>> ESR: 3  10/27>> CT angio head/neck: No LVO or hemodynamically significant stenosis.  No evidence of dissection. 10/28>> MRI brain: No acute intracranial abnormality 10/28>> MRA head/neck: No flow-limiting stenosis.  Brief Hospital Course: Mild ketoacidosis: Multiple etiologies possible-poor oral intake over the past few days- could have starvation ketoacidosis-is on oral diabetic medications that could cause ketoacidosis as well.  Bicarb levels have normalized.  Hold Jardiance on discharge.   DM-2 (A1c 8.1 on 10/28): Briefly on insulin infusion-placed on Lantus/SSI during this hospitalization.  CBGs were stable.  Continue to hold SGLT2 inhibitors/Jardiance-continue semaglutide-and add metformin on discharge.  Claims she has had some GI upset with metformin in the past-we will try to see if starting with a daily dose of metformin  alleviates her GI symptoms-she is aware that if after a few days-if she has no GI symptoms-she can increase metformin to twice daily.  Follow-up with PCP.    Headache/altered mental status: Resolved-suspicion for possible migraine headache or headache related to uncontrolled hypertension/acidosis.  Neuroimaging negative for any structural abnormalities.  ESR stable.  Given 1 dose of migraine cocktail yesterday.  Is on verapamil that we will help with migraine prophylaxis.  Needs outpatient follow-up with neurology if headaches recur.     HTN: BP on the higher side-continue verapamil (will increase dosage) and losartan on discharge.  Since already on verapamil-do not think she needs to be on another calcium channel blocker-hence have discontinued amlodipine.   GERD: Continue PPI   Hypokalemia: Repleted.  Procedures None  Discharge Diagnoses:  Principal Problem:   DKA (diabetic ketoacidosis) ([place]) Active Problems:   Headache   Confusion   Hypertensive urgency   HLD (hyperlipidemia)   Discharge Instructions:  Activity:  As tolerated   Discharge Instructions     Ambulatory referral to Neurology   Complete by: As directed    An appointment is requested in approximately: 2-3 weeks to a [facility] specialist.   Diet - low sodium heart healthy   Complete by: As directed    Diet Carb Modified   Complete by: As directed    Discharge instructions   Complete by: As directed    Follow with Primary MD  Carol Ada, MD in 1-2 weeks  Please get a complete blood count and chemistry panel checked by your Primary  MD at your next visit, and again as instructed by your Primary MD.  Get Medicines reviewed and adjusted: Please take all your medications with you for your next visit with your Primary MD  Laboratory/radiological data: Please request your Primary MD to go over all hospital tests and procedure/radiological results at the follow up, please ask your Primary MD to get all Hospital  records sent to his/her office.  In some cases, they will be blood work, cultures and biopsy results pending at the time of your discharge. Please request that your primary care M.D. follows up on these results.  Also Note the following: If you experience worsening of your admission symptoms, develop shortness of breath, life threatening emergency, suicidal or homicidal thoughts you must seek medical attention immediately by calling 911 or calling your MD immediately  if symptoms less severe.  You must read complete instructions/literature along with all the possible adverse reactions/side effects for all the Medicines you take and that have been prescribed to you. Take any new Medicines after you have completely understood and accpet all the possible adverse reactions/side effects.   Do not drive when taking Pain medications or sleeping medications (Benzodaizepines)  Do not take more than prescribed Pain, Sleep and Anxiety Medications. It is not advisable to combine anxiety,sleep and pain medications without talking with your primary care practitioner  Special Instructions: If you have smoked or chewed Tobacco  in the last 2 yrs please stop smoking, stop any regular Alcohol  and or any Recreational drug use.  Wear Seat belts while driving.  Please note: You were cared for by a hospitalist during your hospital stay. Once you are discharged, your primary care physician will handle any further medical issues. Please note that NO REFILLS for any discharge medications will be authorized once you are discharged, as it is imperative that you return to your primary care physician (or establish a relationship with a primary care physician if you do not have one) for your post hospital discharge needs so that they can reassess your need for medications and monitor your lab values.   1.  Continue to hold Jardiance on discharge.  2.  You will be called by neurology for a follow-up appointment-if you do not  hear from them-please give them a call.   Increase activity slowly   Complete by: As directed       Allergies as of 11/27/2020       Reactions   Metformin    Other reaction(s): GI Issues   Prednisone Hives, Swelling   Semaglutide(0.25 Or 0.42m-dos)    Other reaction(s): Nausea   Amoxicillin Rash   Sulfa Antibiotics Rash        Medication List     STOP taking these medications    amLODipine 2.5 MG tablet Commonly known as: NORVASC   Jardiance 25 MG Tabs tablet Generic drug: empagliflozin       TAKE these medications    acetaminophen 500 MG tablet Commonly known as: TYLENOL Take 1,000 mg by mouth every 6 (six) hours as needed for moderate pain or headache.   atorvastatin 20 MG tablet Commonly known as: LIPITOR Take 20 mg by mouth daily.   escitalopram 10 MG tablet Commonly known as: LEXAPRO Take 15 mg by mouth daily.   losartan 100 MG tablet Commonly known as: COZAAR Take 100 mg by mouth daily.   metFORMIN 500 MG tablet Commonly known as: Glucophage Take 1 tablet (500 mg total) by mouth daily with breakfast. Start start  once a day for the next start by taking once a day for the next 2-3 days-if you do not have any GI issues-increase it to twice a day.   multivitamin with minerals Tabs tablet Take 1 tablet by mouth daily.   omeprazole 40 MG capsule Commonly known as: PRILOSEC Take 40 mg by mouth daily.   ONE TOUCH ULTRA 2 w/Device Kit Check blood sugar once daily in the morning   rOPINIRole 2 MG tablet Commonly known as: REQUIP Take 2 mg by mouth daily.   Rybelsus 3 MG Tabs Generic drug: Semaglutide Take 3 mg by mouth daily.   verapamil 180 MG CR tablet Commonly known as: CALAN-SR Take 1 tablet (180 mg total) by mouth daily. What changed:  medication strength how much to take   Vitamin D 50 MCG (2000 UT) Caps Take 2,000 Units by mouth daily.        Allergies  Allergen Reactions   Metformin     Other reaction(s): GI Issues    Prednisone Hives and Swelling   Semaglutide(0.25 Or 0.69m-Dos)     Other reaction(s): Nausea   Amoxicillin Rash   Sulfa Antibiotics Rash      Consultations:  Neurology   Other Procedures/Studies: CT ANGIO HEAD NECK W WO CM  Result Date: 11/25/2020 CLINICAL DATA:  Altered mental status and headache EXAM: CT ANGIOGRAPHY HEAD AND NECK TECHNIQUE: Multidetector CT imaging of the head and neck was performed using the standard protocol during bolus administration of intravenous contrast. Multiplanar CT image reconstructions and MIPs were obtained to evaluate the vascular anatomy. Carotid stenosis measurements (when applicable) are obtained utilizing NASCET criteria, using the distal internal carotid diameter as the denominator. CONTRAST:  788mOMNIPAQUE IOHEXOL 350 MG/ML SOLN COMPARISON:  None. FINDINGS: CT HEAD Brain: There is no acute intracranial hemorrhage, mass effect, or edema. Gray-white differentiation is preserved. There is no extra-axial fluid collection. Ventricles and sulci are within normal limits in size and configuration. Vascular: No hyperdense vessel . Skull: Calvarium is unremarkable. Sinuses/Orbits: No acute finding. Other: None. Review of the MIP images confirms the above findings CTA NECK Aortic arch: Great vessel origins are patent. Right carotid system: Patent. Mixed plaque along the proximal internal carotid causing less than 50% stenosis. Common carotid retropharyngeal course. Left carotid system: Patent. No stenosis. Common carotid retropharyngeal course. Vertebral arteries: Patent and codominant.  No stenosis. Skeleton: Right facet predominant cervical spine degenerative changes. Other neck: Unremarkable. Upper chest: Included upper lungs are clear. Review of the MIP images confirms the above findings CTA HEAD Anterior circulation: Intracranial internal carotid arteries are patent. Anterior and middle cerebral arteries are patent. Posterior circulation: Intracranial vertebral  arteries are patent. Basilar artery is patent. Major cerebellar artery origins are patent. Posterior communicating arteries are present bilaterally. Posterior cerebral arteries are patent. Venous sinuses: Patent as allowed by contrast bolus timing. Review of the MIP images confirms the above findings IMPRESSION: No acute intracranial abnormality. No large vessel occlusion, hemodynamically significant stenosis, or evidence of dissection. Plaque at the proximal right ICA causes less than 50% stenosis. Electronically Signed   By: PrMacy Mis.D.   On: 11/25/2020 18:40   CT Head Wo Contrast  Result Date: 11/22/2020 CLINICAL DATA:  Persistent headache EXAM: CT HEAD WITHOUT CONTRAST TECHNIQUE: Contiguous axial images were obtained from the base of the skull through the vertex without intravenous contrast. COMPARISON:  None. FINDINGS: Brain: No evidence of acute infarction, hemorrhage, hydrocephalus, extra-axial collection or mass lesion/mass effect. Vascular: No hyperdense vessel or unexpected  calcification. Skull: Normal. Negative for fracture or focal lesion. Sinuses/Orbits: No acute finding. Other: None. IMPRESSION: Normal head CT. Electronically Signed   By: Jacqulynn Cadet M.D.   On: 11/22/2020 10:39   MR ANGIO HEAD WO CONTRAST  Result Date: 11/26/2020 CLINICAL DATA:  Stroke suspected. EXAM: MRI HEAD WITHOUT AND WITH CONTRAST MRA HEAD WITHOUT CONTRAST MRA NECK WITHOUT AND WITH CONTRAST TECHNIQUE: Multiplanar, multiecho pulse sequences of the brain and surrounding structures were obtained without and with intravenous contrast. Angiographic images of the Circle of Willis were obtained using MRA technique without intravenous contrast. Angiographic images of the neck were obtained using MRA technique without and with intravenous contrast. Carotid stenosis measurements (when applicable) are obtained utilizing NASCET criteria, using the distal internal carotid diameter as the denominator. CONTRAST:  68m  GADAVIST GADOBUTROL 1 MMOL/ML IV SOLN COMPARISON:  CTA of the head neck from yesterday. FINDINGS: MRI HEAD FINDINGS Brain: No acute infarction, hemorrhage, hydrocephalus, extra-axial collection or mass lesion. Small FLAIR hyperintensities in the cerebral white matter attributed to chronic small vessel disease. No abnormal enhancement. Vascular: Normal flow voids and vascular enhancements. Skull and upper cervical spine: Normal marrow signal. Sinuses/Orbits: Negative. MRA HEAD FINDINGS Major vessels are smooth and widely patent. Mild atheromatous undulation of MCA and ACA branch vessels. Mild to moderate narrowing of the right V4 segment just beyond the PICA origin. There is also a mild narrowing of the right PICA. Negative for aneurysm. MRA NECK FINDINGS Antegrade flow in the carotid and vertebral arteries. Unremarkable neck mask. Normal arch with 3 vessel branching. No flow limiting stenosis, ulceration, or beading in the carotid or vertebral circulation. There is vessel tortuosity greatest at the left ICA. IMPRESSION: Brain MRI: Unremarkable for age. Intracranial MRA: 1. No emergent finding or flow limiting stenosis of major vessels. 2. Mild atheromatous irregularity of medium size branches. Neck MRA: Negative. Electronically Signed   By: JJorje GuildM.D.   On: 11/26/2020 06:53   MR ANGIO NECK W WO CONTRAST  Result Date: 11/26/2020 CLINICAL DATA:  Stroke suspected. EXAM: MRI HEAD WITHOUT AND WITH CONTRAST MRA HEAD WITHOUT CONTRAST MRA NECK WITHOUT AND WITH CONTRAST TECHNIQUE: Multiplanar, multiecho pulse sequences of the brain and surrounding structures were obtained without and with intravenous contrast. Angiographic images of the Circle of Willis were obtained using MRA technique without intravenous contrast. Angiographic images of the neck were obtained using MRA technique without and with intravenous contrast. Carotid stenosis measurements (when applicable) are obtained utilizing NASCET criteria,  using the distal internal carotid diameter as the denominator. CONTRAST:  669mGADAVIST GADOBUTROL 1 MMOL/ML IV SOLN COMPARISON:  CTA of the head neck from yesterday. FINDINGS: MRI HEAD FINDINGS Brain: No acute infarction, hemorrhage, hydrocephalus, extra-axial collection or mass lesion. Small FLAIR hyperintensities in the cerebral white matter attributed to chronic small vessel disease. No abnormal enhancement. Vascular: Normal flow voids and vascular enhancements. Skull and upper cervical spine: Normal marrow signal. Sinuses/Orbits: Negative. MRA HEAD FINDINGS Major vessels are smooth and widely patent. Mild atheromatous undulation of MCA and ACA branch vessels. Mild to moderate narrowing of the right V4 segment just beyond the PICA origin. There is also a mild narrowing of the right PICA. Negative for aneurysm. MRA NECK FINDINGS Antegrade flow in the carotid and vertebral arteries. Unremarkable neck mask. Normal arch with 3 vessel branching. No flow limiting stenosis, ulceration, or beading in the carotid or vertebral circulation. There is vessel tortuosity greatest at the left ICA. IMPRESSION: Brain MRI: Unremarkable for age. Intracranial MRA: 1. No  emergent finding or flow limiting stenosis of major vessels. 2. Mild atheromatous irregularity of medium size branches. Neck MRA: Negative. Electronically Signed   By: Jorje Guild M.D.   On: 11/26/2020 06:53   MR BRAIN W WO CONTRAST  Result Date: 11/26/2020 CLINICAL DATA:  Stroke suspected. EXAM: MRI HEAD WITHOUT AND WITH CONTRAST MRA HEAD WITHOUT CONTRAST MRA NECK WITHOUT AND WITH CONTRAST TECHNIQUE: Multiplanar, multiecho pulse sequences of the brain and surrounding structures were obtained without and with intravenous contrast. Angiographic images of the Circle of Willis were obtained using MRA technique without intravenous contrast. Angiographic images of the neck were obtained using MRA technique without and with intravenous contrast. Carotid stenosis  measurements (when applicable) are obtained utilizing NASCET criteria, using the distal internal carotid diameter as the denominator. CONTRAST:  29m GADAVIST GADOBUTROL 1 MMOL/ML IV SOLN COMPARISON:  CTA of the head neck from yesterday. FINDINGS: MRI HEAD FINDINGS Brain: No acute infarction, hemorrhage, hydrocephalus, extra-axial collection or mass lesion. Small FLAIR hyperintensities in the cerebral white matter attributed to chronic small vessel disease. No abnormal enhancement. Vascular: Normal flow voids and vascular enhancements. Skull and upper cervical spine: Normal marrow signal. Sinuses/Orbits: Negative. MRA HEAD FINDINGS Major vessels are smooth and widely patent. Mild atheromatous undulation of MCA and ACA branch vessels. Mild to moderate narrowing of the right V4 segment just beyond the PICA origin. There is also a mild narrowing of the right PICA. Negative for aneurysm. MRA NECK FINDINGS Antegrade flow in the carotid and vertebral arteries. Unremarkable neck mask. Normal arch with 3 vessel branching. No flow limiting stenosis, ulceration, or beading in the carotid or vertebral circulation. There is vessel tortuosity greatest at the left ICA. IMPRESSION: Brain MRI: Unremarkable for age. Intracranial MRA: 1. No emergent finding or flow limiting stenosis of major vessels. 2. Mild atheromatous irregularity of medium size branches. Neck MRA: Negative. Electronically Signed   By: JJorje GuildM.D.   On: 11/26/2020 06:53   EEG adult  Result Date: 11/26/2020 YLora Havens MD     11/26/2020  1:59 PM Patient Name: Madison DIMAANOMRN: [id number] Attending: PLora HavensReferring Physician/Provider: KClance Boll NP Date: 11/26/2020 Duration: 23.26 mins Patient history: 75year old female with aphasia in the setting of euglycemic DKA.  EEG to evaluate for seizure. Level of alertness: Awake, asleep AEDs during EEG study: Ativan Technical aspects: This EEG study was done with scalp  electrodes positioned according to the 10-20 International system of electrode placement. Electrical activity was acquired at a sampling rate of 500Hz  and reviewed with a high frequency filter of 70Hz  and a low frequency filter of 1Hz . EEG data were recorded continuously and digitally stored. Description: The posterior dominant rhythm consists of 8Hz  activity of moderate voltage (25-35 uV) seen predominantly in posterior head regions, symmetric and reactive to eye opening and eye closing. Sleep was characterized by vertex waves, sleep spindles (12 to 14 Hz), maximal frontocentral region. Hyperventilation and photic stimulation were not performed.   IMPRESSION: This study is within normal limits. No seizures or epileptiform discharges were seen throughout the recording. Priyanka OBarbra Sarks    TODAY-DAY OF DISCHARGE:  Subjective:   WStepheni Camerontoday has no headache,no chest abdominal pain,no new weakness tingling or numbness, feels much better wants to go home today.  Objective:   Blood pressure (!) 148/59, pulse 72, temperature 99 F (37.2 C), temperature source Oral, resp. rate 16, SpO2 97 %.  Intake/Output Summary (Last 24 hours) at 11/27/2020 1004 Last data  filed at 11/26/2020 1112 Gross per 24 hour  Intake 1344.19 ml  Output --  Net 1344.19 ml   There were no vitals filed for this visit.  Exam: Awake Alert, Oriented *3, No new F.N deficits, Normal affect [place].AT,PERRAL Supple Neck,No JVD, No cervical lymphadenopathy appriciated.  Symmetrical Chest wall movement, Good air movement bilaterally, CTAB RRR,No Gallops,Rubs or new Murmurs, No Parasternal Heave +ve B.Sounds, Abd Soft, Non tender, No organomegaly appriciated, No rebound -guarding or rigidity. No Cyanosis, Clubbing or edema, No new Rash or bruise   PERTINENT RADIOLOGIC STUDIES: CT ANGIO HEAD NECK W WO CM  Result Date: 11/25/2020 CLINICAL DATA:  Altered mental status and headache EXAM: CT ANGIOGRAPHY HEAD AND NECK  TECHNIQUE: Multidetector CT imaging of the head and neck was performed using the standard protocol during bolus administration of intravenous contrast. Multiplanar CT image reconstructions and MIPs were obtained to evaluate the vascular anatomy. Carotid stenosis measurements (when applicable) are obtained utilizing NASCET criteria, using the distal internal carotid diameter as the denominator. CONTRAST:  25m OMNIPAQUE IOHEXOL 350 MG/ML SOLN COMPARISON:  None. FINDINGS: CT HEAD Brain: There is no acute intracranial hemorrhage, mass effect, or edema. Gray-white differentiation is preserved. There is no extra-axial fluid collection. Ventricles and sulci are within normal limits in size and configuration. Vascular: No hyperdense vessel . Skull: Calvarium is unremarkable. Sinuses/Orbits: No acute finding. Other: None. Review of the MIP images confirms the above findings CTA NECK Aortic arch: Great vessel origins are patent. Right carotid system: Patent. Mixed plaque along the proximal internal carotid causing less than 50% stenosis. Common carotid retropharyngeal course. Left carotid system: Patent. No stenosis. Common carotid retropharyngeal course. Vertebral arteries: Patent and codominant.  No stenosis. Skeleton: Right facet predominant cervical spine degenerative changes. Other neck: Unremarkable. Upper chest: Included upper lungs are clear. Review of the MIP images confirms the above findings CTA HEAD Anterior circulation: Intracranial internal carotid arteries are patent. Anterior and middle cerebral arteries are patent. Posterior circulation: Intracranial vertebral arteries are patent. Basilar artery is patent. Major cerebellar artery origins are patent. Posterior communicating arteries are present bilaterally. Posterior cerebral arteries are patent. Venous sinuses: Patent as allowed by contrast bolus timing. Review of the MIP images confirms the above findings IMPRESSION: No acute intracranial abnormality. No  large vessel occlusion, hemodynamically significant stenosis, or evidence of dissection. Plaque at the proximal right ICA causes less than 50% stenosis. Electronically Signed   By: PMacy MisM.D.   On: 11/25/2020 18:40   MR ANGIO HEAD WO CONTRAST  Result Date: 11/26/2020 CLINICAL DATA:  Stroke suspected. EXAM: MRI HEAD WITHOUT AND WITH CONTRAST MRA HEAD WITHOUT CONTRAST MRA NECK WITHOUT AND WITH CONTRAST TECHNIQUE: Multiplanar, multiecho pulse sequences of the brain and surrounding structures were obtained without and with intravenous contrast. Angiographic images of the Circle of Willis were obtained using MRA technique without intravenous contrast. Angiographic images of the neck were obtained using MRA technique without and with intravenous contrast. Carotid stenosis measurements (when applicable) are obtained utilizing NASCET criteria, using the distal internal carotid diameter as the denominator. CONTRAST:  632mGADAVIST GADOBUTROL 1 MMOL/ML IV SOLN COMPARISON:  CTA of the head neck from yesterday. FINDINGS: MRI HEAD FINDINGS Brain: No acute infarction, hemorrhage, hydrocephalus, extra-axial collection or mass lesion. Small FLAIR hyperintensities in the cerebral white matter attributed to chronic small vessel disease. No abnormal enhancement. Vascular: Normal flow voids and vascular enhancements. Skull and upper cervical spine: Normal marrow signal. Sinuses/Orbits: Negative. MRA HEAD FINDINGS Major vessels are smooth and widely  patent. Mild atheromatous undulation of MCA and ACA branch vessels. Mild to moderate narrowing of the right V4 segment just beyond the PICA origin. There is also a mild narrowing of the right PICA. Negative for aneurysm. MRA NECK FINDINGS Antegrade flow in the carotid and vertebral arteries. Unremarkable neck mask. Normal arch with 3 vessel branching. No flow limiting stenosis, ulceration, or beading in the carotid or vertebral circulation. There is vessel tortuosity greatest at  the left ICA. IMPRESSION: Brain MRI: Unremarkable for age. Intracranial MRA: 1. No emergent finding or flow limiting stenosis of major vessels. 2. Mild atheromatous irregularity of medium size branches. Neck MRA: Negative. Electronically Signed   By: Jorje Guild M.D.   On: 11/26/2020 06:53   MR ANGIO NECK W WO CONTRAST  Result Date: 11/26/2020 CLINICAL DATA:  Stroke suspected. EXAM: MRI HEAD WITHOUT AND WITH CONTRAST MRA HEAD WITHOUT CONTRAST MRA NECK WITHOUT AND WITH CONTRAST TECHNIQUE: Multiplanar, multiecho pulse sequences of the brain and surrounding structures were obtained without and with intravenous contrast. Angiographic images of the Circle of Willis were obtained using MRA technique without intravenous contrast. Angiographic images of the neck were obtained using MRA technique without and with intravenous contrast. Carotid stenosis measurements (when applicable) are obtained utilizing NASCET criteria, using the distal internal carotid diameter as the denominator. CONTRAST:  64m GADAVIST GADOBUTROL 1 MMOL/ML IV SOLN COMPARISON:  CTA of the head neck from yesterday. FINDINGS: MRI HEAD FINDINGS Brain: No acute infarction, hemorrhage, hydrocephalus, extra-axial collection or mass lesion. Small FLAIR hyperintensities in the cerebral white matter attributed to chronic small vessel disease. No abnormal enhancement. Vascular: Normal flow voids and vascular enhancements. Skull and upper cervical spine: Normal marrow signal. Sinuses/Orbits: Negative. MRA HEAD FINDINGS Major vessels are smooth and widely patent. Mild atheromatous undulation of MCA and ACA branch vessels. Mild to moderate narrowing of the right V4 segment just beyond the PICA origin. There is also a mild narrowing of the right PICA. Negative for aneurysm. MRA NECK FINDINGS Antegrade flow in the carotid and vertebral arteries. Unremarkable neck mask. Normal arch with 3 vessel branching. No flow limiting stenosis, ulceration, or beading in the  carotid or vertebral circulation. There is vessel tortuosity greatest at the left ICA. IMPRESSION: Brain MRI: Unremarkable for age. Intracranial MRA: 1. No emergent finding or flow limiting stenosis of major vessels. 2. Mild atheromatous irregularity of medium size branches. Neck MRA: Negative. Electronically Signed   By: JJorje GuildM.D.   On: 11/26/2020 06:53   MR BRAIN W WO CONTRAST  Result Date: 11/26/2020 CLINICAL DATA:  Stroke suspected. EXAM: MRI HEAD WITHOUT AND WITH CONTRAST MRA HEAD WITHOUT CONTRAST MRA NECK WITHOUT AND WITH CONTRAST TECHNIQUE: Multiplanar, multiecho pulse sequences of the brain and surrounding structures were obtained without and with intravenous contrast. Angiographic images of the Circle of Willis were obtained using MRA technique without intravenous contrast. Angiographic images of the neck were obtained using MRA technique without and with intravenous contrast. Carotid stenosis measurements (when applicable) are obtained utilizing NASCET criteria, using the distal internal carotid diameter as the denominator. CONTRAST:  643mGADAVIST GADOBUTROL 1 MMOL/ML IV SOLN COMPARISON:  CTA of the head neck from yesterday. FINDINGS: MRI HEAD FINDINGS Brain: No acute infarction, hemorrhage, hydrocephalus, extra-axial collection or mass lesion. Small FLAIR hyperintensities in the cerebral white matter attributed to chronic small vessel disease. No abnormal enhancement. Vascular: Normal flow voids and vascular enhancements. Skull and upper cervical spine: Normal marrow signal. Sinuses/Orbits: Negative. MRA HEAD FINDINGS Major vessels are smooth and  widely patent. Mild atheromatous undulation of MCA and ACA branch vessels. Mild to moderate narrowing of the right V4 segment just beyond the PICA origin. There is also a mild narrowing of the right PICA. Negative for aneurysm. MRA NECK FINDINGS Antegrade flow in the carotid and vertebral arteries. Unremarkable neck mask. Normal arch with 3 vessel  branching. No flow limiting stenosis, ulceration, or beading in the carotid or vertebral circulation. There is vessel tortuosity greatest at the left ICA. IMPRESSION: Brain MRI: Unremarkable for age. Intracranial MRA: 1. No emergent finding or flow limiting stenosis of major vessels. 2. Mild atheromatous irregularity of medium size branches. Neck MRA: Negative. Electronically Signed   By: Jorje Guild M.D.   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  EEG to evaluate for seizure. Level of alertness: Awake, asleep AEDs during EEG study: Ativan Technical aspects: This EEG study was done with scalp electrodes positioned according to the 10-20 International system of electrode placement. Electrical activity was acquired at a sampling rate of 500Hz  and reviewed with a high frequency filter of 70Hz  and a low frequency filter of 1Hz . EEG data were recorded continuously and digitally stored. Description: The posterior dominant rhythm consists of 8Hz  activity of moderate voltage (25-35 uV) seen predominantly in posterior head regions, symmetric and reactive to eye opening and eye closing. Sleep was characterized by vertex waves, sleep spindles (12 to 14 Hz), maximal frontocentral region. Hyperventilation and photic stimulation were not performed.   IMPRESSION: This study is within normal limits. No seizures or epileptiform discharges were seen throughout the recording. Priyanka Barbra Sarks     PERTINENT LAB RESULTS: CBC: Recent Labs    11/26/20 0534 11/27/20 0402  WBC 16.1* 10.0  HGB 13.9 13.4  HCT 41.0 41.0  PLT 316 274   CMET CMP     Component Value Date/Time   NA 138 11/27/2020 0402   K  3.3 (L) 11/27/2020 0402   CL 107 11/27/2020 0402   CO2 23 11/27/2020 0402   GLUCOSE 71 11/27/2020 0402   BUN 15 11/27/2020 0402   CREATININE 0.61 11/27/2020 0402   CALCIUM 8.6 (L) 11/27/2020 0402   PROT 7.4 11/25/2020 1619   ALBUMIN 4.7 11/25/2020 1619   AST 24 11/25/2020 1619   ALT 34 11/25/2020 1619   ALKPHOS 64 11/25/2020 1619   BILITOT 0.7 11/25/2020 1619   GFRNONAA >60 11/27/2020 0402   GFRAA >90 12/20/2011 0621    GFR Estimated Creatinine Clearance: 52.4 mL/min (by C-G formula based on SCr of 0.61 mg/dL). No results for input(s): LIPASE, AMYLASE in the last 72 hours. No results for input(s): CKTOTAL, CKMB, CKMBINDEX, TROPONINI in the last 72 hours. Invalid input(s): POCBNP No results for input(s): DDIMER in the last 72 hours. Recent Labs    11/26/20 0228  HGBA1C 8.1*   No results for input(s): CHOL, HDL, LDLCALC, TRIG, CHOLHDL, LDLDIRECT in the last 72 hours. Recent Labs    11/25/20 1619  TSH 0.730   No results for input(s): VITAMINB12, FOLATE, FERRITIN, TIBC, IRON, RETICCTPCT in the last 72 hours. Coags: Recent Labs    11/25/20 1619  INR 0.9   Microbiology: Recent Results (from the past 240 hour(s))  Resp Panel by RT-PCR (  Flu A&B, Covid) Nasopharyngeal Swab     Status: None   Collection Time: 11/25/20  8:15 PM   Specimen: Nasopharyngeal Swab; Nasopharyngeal(NP) swabs in vial transport medium  Result Value Ref Range Status   SARS Coronavirus 2 by RT PCR NEGATIVE NEGATIVE Final    Comment: (NOTE) SARS-CoV-2 target nucleic acids are NOT DETECTED.  The SARS-CoV-2 RNA is generally detectable in upper respiratory specimens during the acute phase of infection. The lowest concentration of SARS-CoV-2 viral copies this assay can detect is 138 copies/mL. A negative result does not preclude SARS-Cov-2 infection and should not be used as the sole basis for treatment or other patient management decisions. A negative result may occur with  improper specimen  collection/handling, submission of specimen other than nasopharyngeal swab, presence of viral mutation(s) within the areas targeted by this assay, and inadequate number of viral copies(<138 copies/mL). A negative result must be combined with clinical observations, patient history, and epidemiological information. The expected result is Negative.  Fact Sheet for Patients:  EntrepreneurPulse.com.au  Fact Sheet for Healthcare Providers:  IncredibleEmployment.be  This test is no t yet approved or cleared by the [place] FDA and  has been authorized for detection and/or diagnosis of SARS-CoV-2 by FDA under an Emergency Use Authorization (EUA). This EUA will remain  in effect (meaning this test can be used) for the duration of the COVID-19 declaration under Section 564(b)(1) of the Act, 21 U.S.C.section 360bbb-3(b)(1), unless the authorization is terminated  or revoked sooner.       Influenza A by PCR NEGATIVE NEGATIVE Final   Influenza B by PCR NEGATIVE NEGATIVE Final    Comment: (NOTE) The Xpert Xpress SARS-CoV-2/FLU/RSV plus assay is intended as an aid in the diagnosis of influenza from Nasopharyngeal swab specimens and should not be used as a sole basis for treatment. Nasal washings and aspirates are unacceptable for Xpert Xpress SARS-CoV-2/FLU/RSV testing.  Fact Sheet for Patients: EntrepreneurPulse.com.au  Fact Sheet for Healthcare Providers: IncredibleEmployment.be  This test is not yet approved or cleared by the [place] FDA and has been authorized for detection and/or diagnosis of SARS-CoV-2 by FDA under an Emergency Use Authorization (EUA). This EUA will remain in effect (meaning this test can be used) for the duration of the COVID-19 declaration under Section 564(b)(1) of the Act, 21 U.S.C. section 360bbb-3(b)(1), unless the authorization is terminated or revoked.  Performed at [facility], [address] [place], [place] [address]     FURTHER DISCHARGE INSTRUCTIONS:  Get Medicines reviewed and adjusted: Please take all your medications with you for your next visit with your Primary MD  Laboratory/radiological data: Please request your Primary MD to go over all hospital tests and procedure/radiological results at the follow up, please ask your Primary MD to get all Hospital records sent to his/her office.  In some cases, they will be blood work, cultures and biopsy results pending at the time of your discharge. Please request that your primary care M.D. goes through all the records of your hospital data and follows up on these results.  Also Note the following: If you experience worsening of your admission symptoms, develop shortness of breath, life threatening emergency, suicidal or homicidal thoughts you must seek medical attention immediately by calling 911 or calling your MD immediately  if symptoms less severe.  You must read complete instructions/literature along with all the possible adverse reactions/side effects for all the Medicines you take and that have been prescribed to you. Take any new Medicines after  you have completely understood and accpet all the possible adverse reactions/side effects.   Do not drive when taking Pain medications or sleeping medications (Benzodaizepines)  Do not take more than prescribed Pain, Sleep and Anxiety Medications. It is not advisable to combine anxiety,sleep and pain medications without talking with your primary care practitioner  Special Instructions: If you have smoked or chewed Tobacco  in the last 2 yrs please stop smoking, stop any regular Alcohol  and or any Recreational drug use.  Wear Seat belts while driving.  Please note: You were cared for by a hospitalist during your hospital stay. Once you are discharged, your primary care physician will handle any further medical issues. Please note that  NO REFILLS for any discharge medications will be authorized once you are discharged, as it is imperative that you return to your primary care physician (or establish a relationship with a primary care physician if you do not have one) for your post hospital discharge needs so that they can reassess your need for medications and monitor your lab values.  Total Time spent coordinating discharge including counseling, education and face to face time equals 35 minutes.  Signed: Chantrell Apsey 11/27/2020 10:04 AM

## 2020-11-29 DIAGNOSIS — I10 Essential (primary) hypertension: Secondary | ICD-10-CM | POA: Diagnosis not present

## 2020-11-29 DIAGNOSIS — E1169 Type 2 diabetes mellitus with other specified complication: Secondary | ICD-10-CM | POA: Diagnosis not present

## 2020-11-29 DIAGNOSIS — G4452 New daily persistent headache (NDPH): Secondary | ICD-10-CM | POA: Diagnosis not present

## 2020-11-29 DIAGNOSIS — Z7984 Long term (current) use of oral hypoglycemic drugs: Secondary | ICD-10-CM | POA: Diagnosis not present

## 2020-11-29 LAB — GLUCOSE, CAPILLARY: Glucose-Capillary: 75 mg/dL (ref 70–99)

## 2020-12-02 ENCOUNTER — Encounter: Payer: Self-pay | Admitting: Psychiatry

## 2020-12-02 ENCOUNTER — Ambulatory Visit: Payer: Medicare PPO | Admitting: Psychiatry

## 2020-12-02 VITALS — BP 143/75 | HR 84 | Ht 61.0 in | Wt 136.0 lb

## 2020-12-02 DIAGNOSIS — G43109 Migraine with aura, not intractable, without status migrainosus: Secondary | ICD-10-CM | POA: Diagnosis not present

## 2020-12-02 MED ORDER — TIZANIDINE HCL 2 MG PO TABS: 2.0000 mg | ORAL_TABLET | Freq: Three times a day (TID) | ORAL | 0 refills | Status: AC

## 2020-12-02 MED ORDER — UBRELVY 100 MG PO TABS: 50.0000 mg | ORAL_TABLET | ORAL | 0 refills | Status: DC | PRN

## 2020-12-02 MED ORDER — AJOVY 225 MG/1.5ML ~~LOC~~ SOAJ: 1.0000 "pen " | SUBCUTANEOUS | 0 refills | Status: DC

## 2020-12-02 NOTE — Patient Instructions (Addendum)
Take Ajovy injection once monthly for headache prevention Take Madison Wilson as needed for bad headaches. Take 1/2 pill at the onset of migraine. 5 day course of muscle relaxer to help break current headache cycle. Take up to 3 times a day for 5 days
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
## 2020-12-06 ENCOUNTER — Other Ambulatory Visit: Payer: Self-pay | Admitting: Psychiatry

## 2020-12-06 ENCOUNTER — Telehealth: Payer: Self-pay | Admitting: Psychiatry

## 2020-12-06 MED ORDER — DICLOFENAC POTASSIUM 50 MG PO TABS: ORAL_TABLET | ORAL | 2 refills | Status: AC

## 2020-12-06 NOTE — Telephone Encounter (Signed)
Spoke with patient who has been taking tizanidine 2 x day. Yesterday she had flashing lights on left side and pain on right side. The ubrelvy did relieve some pressure feeling but not headache , tylenol didn't help. She hasn't started Ajovy yet (received 2 samples) .  She wanted Dr Delena Bali to be aware and advise further. Advised I'll send to Dr Delena Bali. Patient verbalized understanding, appreciation.

## 2020-12-06 NOTE — Telephone Encounter (Signed)
Rx for diclofenac sent to her pharmacy. She can take 1-2 pills as needed at the onset of her headache. She should limit use of diclofenac to 2 days per week to avoid rebound headaches. She can combine this with Bernita Raisin if needed.

## 2020-12-06 NOTE — Telephone Encounter (Signed)
Called patient and informed of Dr Quentin Mulling new Rx and the intstructions. Patient verbalized understanding, appreciation.

## 2020-12-06 NOTE — Telephone Encounter (Signed)
Pt called, yesterday at 10am starting having a migraine and gradually subsided at 5pm. Madison Wilson about 12 pm, but did not help and Tylenol little after 1pm. Would like a call from the nurse.

## 2020-12-14 DIAGNOSIS — E1169 Type 2 diabetes mellitus with other specified complication: Secondary | ICD-10-CM | POA: Diagnosis not present

## 2020-12-14 DIAGNOSIS — E7800 Pure hypercholesterolemia, unspecified: Secondary | ICD-10-CM | POA: Diagnosis not present

## 2020-12-14 DIAGNOSIS — I10 Essential (primary) hypertension: Secondary | ICD-10-CM | POA: Diagnosis not present

## 2020-12-14 DIAGNOSIS — Z7984 Long term (current) use of oral hypoglycemic drugs: Secondary | ICD-10-CM | POA: Diagnosis not present

## 2020-12-22 ENCOUNTER — Ambulatory Visit
Admission: RE | Admit: 2020-12-22 | Discharge: 2020-12-22 | Disposition: A | Discharge status: Home / Self Care | Payer: Medicare PPO | Source: Ambulatory Visit | Attending: Family Medicine | Admitting: Family Medicine

## 2020-12-22 DIAGNOSIS — Z1231 Encounter for screening mammogram for malignant neoplasm of breast: Secondary | ICD-10-CM | POA: Diagnosis not present

## 2020-12-22 IMAGING — MG MM DIGITAL SCREENING BILAT W/ TOMO AND CAD
8 series · 9 of 24 positions shown · non-contrast
Comparison: Previous exam(s).

CLINICAL DATA: Screening.

EXAM:
DIGITAL SCREENING BILATERAL MAMMOGRAM WITH TOMOSYNTHESIS AND CAD
TECHNIQUE: Bilateral screening digital craniocaudal and mediolateral oblique
mammograms were obtained. Bilateral screening digital breast
tomosynthesis was performed. The images were evaluated with
computer-aided detection.

[R CC synth-2D]
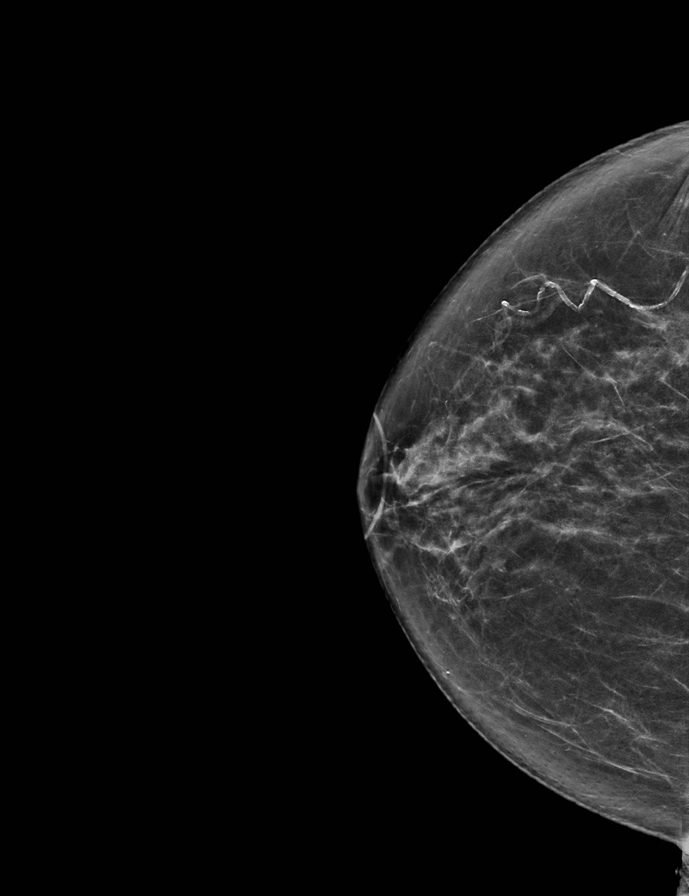

[L CC synth-2D]
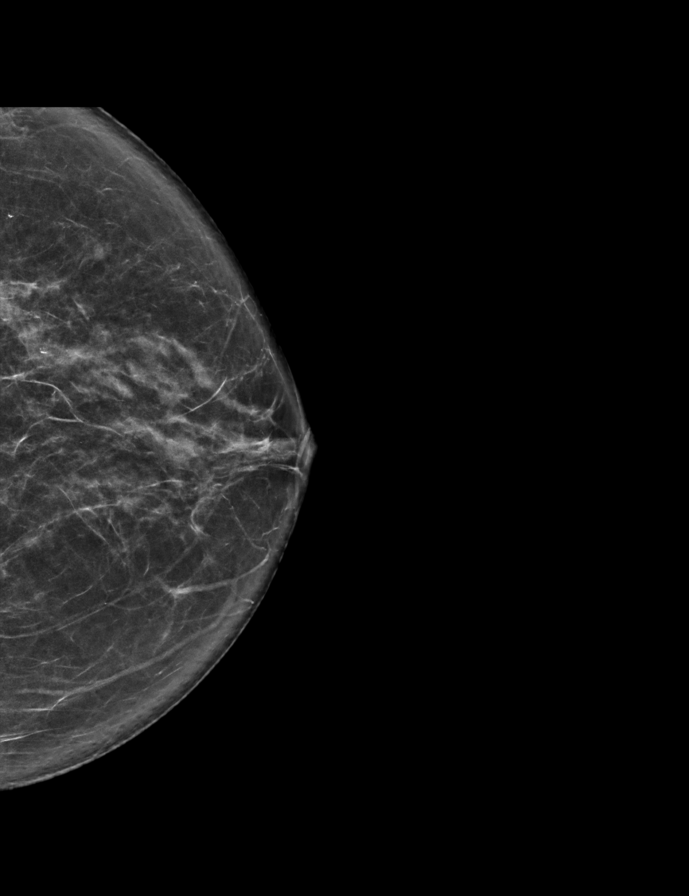

[L MLO synth-2D]
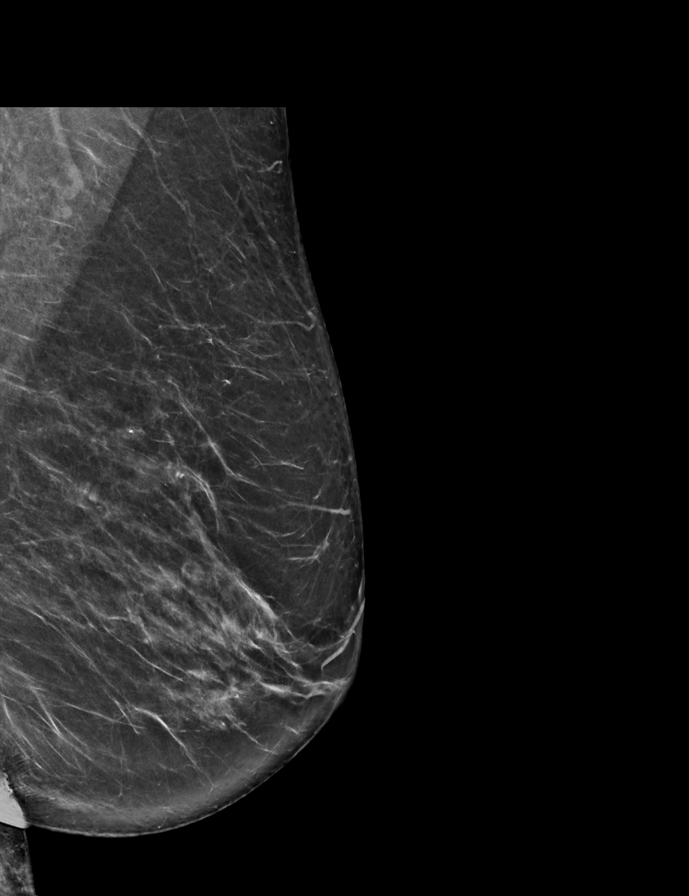

[R MLO synth-2D]
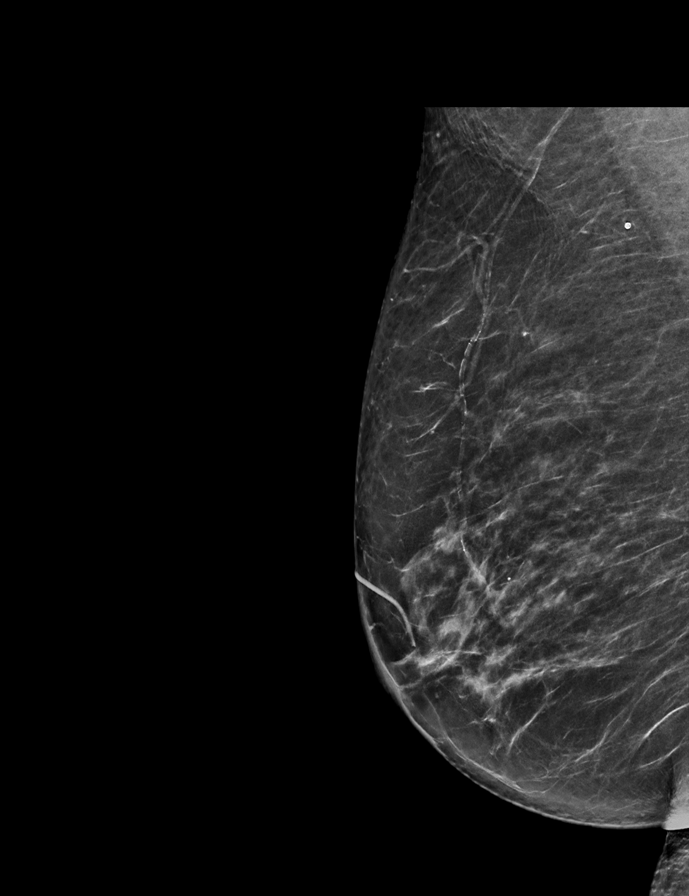

[R MLO tomo · 2 of 68 frames shown]
[frame 22/68]
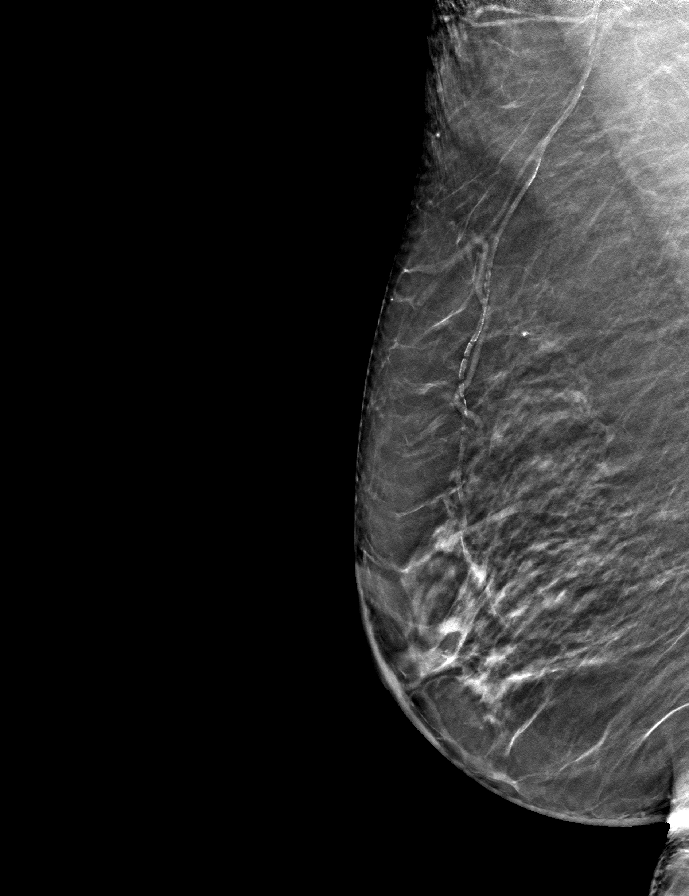
[frame 35/68]
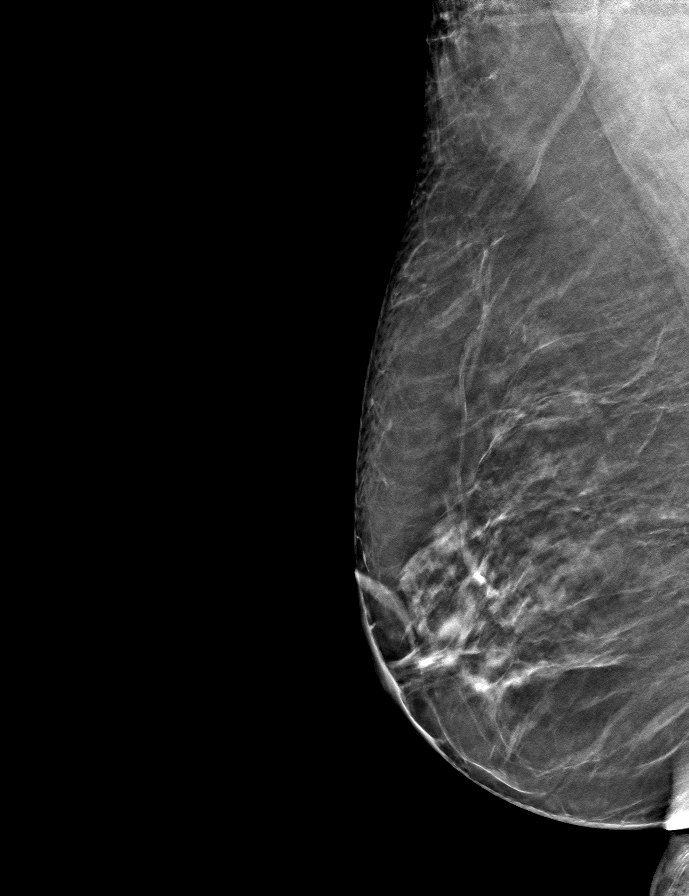

[L CC tomo · tomo slice 34/67.0]
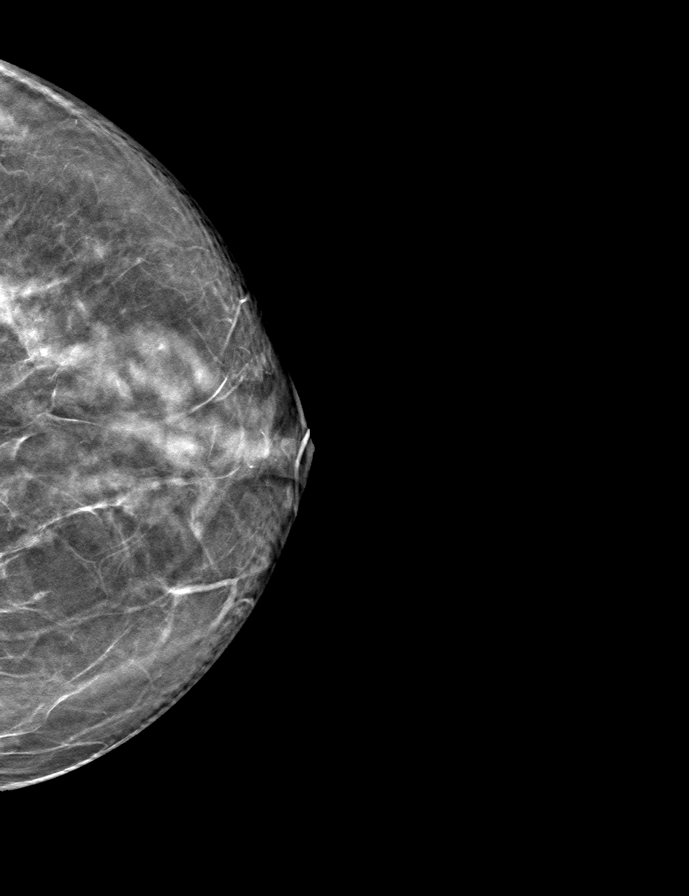

[R CC tomo · tomo slice 35/68.0]
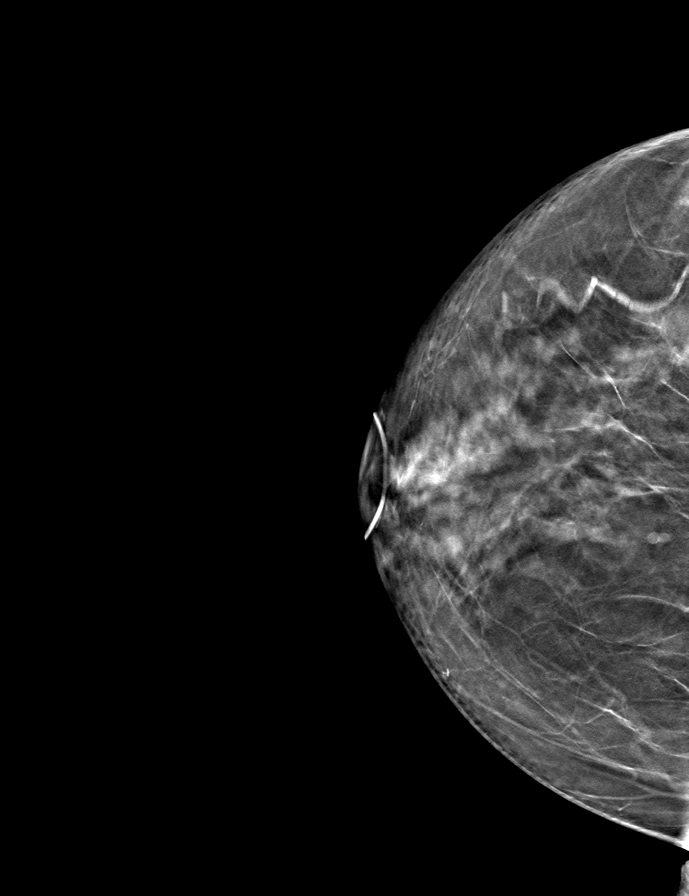

[L MLO tomo · tomo slice 36/71.0]
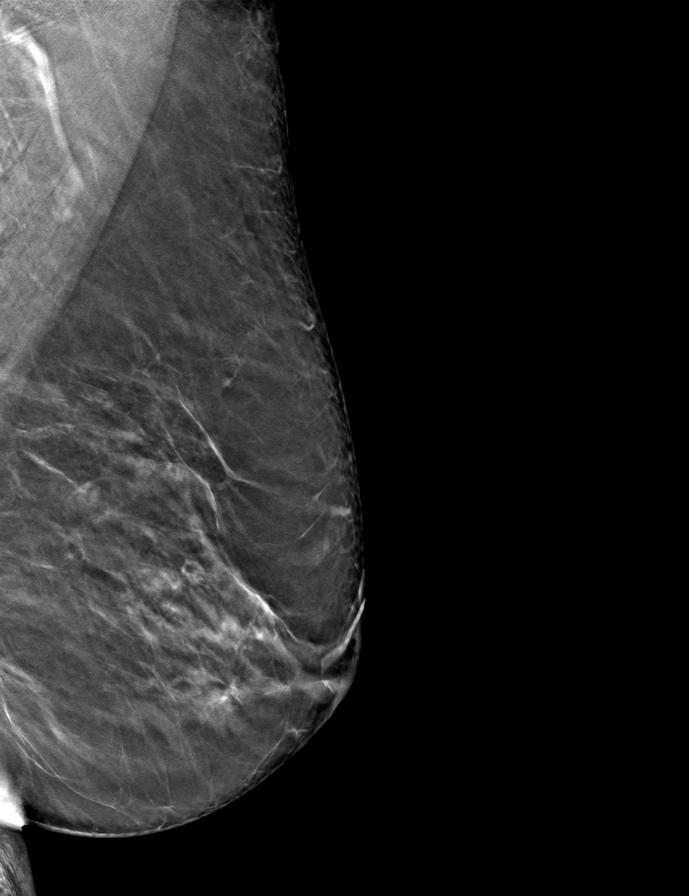

[9 of 24 positions shown; findings below may reference images not displayed]

ACR Breast Density Category c: The breast tissue is heterogeneously
dense, which may obscure small masses.
FINDINGS: There are no findings suspicious for malignancy.
IMPRESSION: No mammographic evidence of malignancy. A result letter of this
screening mammogram will be mailed directly to the patient.

RECOMMENDATION:
Screening mammogram in one year. (Code:[V2])

BI-RADS CATEGORY  1: Negative.

## 2021-01-06 DIAGNOSIS — E7800 Pure hypercholesterolemia, unspecified: Secondary | ICD-10-CM | POA: Diagnosis not present

## 2021-01-06 DIAGNOSIS — I10 Essential (primary) hypertension: Secondary | ICD-10-CM | POA: Diagnosis not present

## 2021-01-06 DIAGNOSIS — Z7984 Long term (current) use of oral hypoglycemic drugs: Secondary | ICD-10-CM | POA: Diagnosis not present

## 2021-01-06 DIAGNOSIS — E1169 Type 2 diabetes mellitus with other specified complication: Secondary | ICD-10-CM | POA: Diagnosis not present

## 2021-02-28 DIAGNOSIS — Z7984 Long term (current) use of oral hypoglycemic drugs: Secondary | ICD-10-CM | POA: Diagnosis not present

## 2021-02-28 DIAGNOSIS — E1169 Type 2 diabetes mellitus with other specified complication: Secondary | ICD-10-CM | POA: Diagnosis not present

## 2021-02-28 DIAGNOSIS — G2581 Restless legs syndrome: Secondary | ICD-10-CM | POA: Diagnosis not present

## 2021-02-28 DIAGNOSIS — E7800 Pure hypercholesterolemia, unspecified: Secondary | ICD-10-CM | POA: Diagnosis not present

## 2021-02-28 DIAGNOSIS — R21 Rash and other nonspecific skin eruption: Secondary | ICD-10-CM | POA: Diagnosis not present

## 2021-02-28 DIAGNOSIS — F419 Anxiety disorder, unspecified: Secondary | ICD-10-CM | POA: Diagnosis not present

## 2021-02-28 DIAGNOSIS — I10 Essential (primary) hypertension: Secondary | ICD-10-CM | POA: Diagnosis not present

## 2021-03-14 ENCOUNTER — Encounter: Payer: Self-pay | Admitting: Psychiatry

## 2021-03-14 ENCOUNTER — Ambulatory Visit: Payer: Medicare PPO | Admitting: Psychiatry

## 2021-03-14 ENCOUNTER — Telehealth: Payer: Self-pay | Admitting: *Deleted

## 2021-03-14 VITALS — BP 160/86 | HR 65 | Ht 61.0 in | Wt 134.0 lb

## 2021-03-14 DIAGNOSIS — G43119 Migraine with aura, intractable, without status migrainosus: Secondary | ICD-10-CM | POA: Diagnosis not present

## 2021-03-14 MED ORDER — AJOVY 225 MG/1.5ML ~~LOC~~ SOAJ: 1.0000 "pen " | SUBCUTANEOUS | 3 refills | Status: DC

## 2021-03-14 MED ORDER — UBRELVY 50 MG PO TABS: 50.0000 mg | ORAL_TABLET | ORAL | 3 refills | Status: AC | PRN

## 2021-03-14 NOTE — Telephone Encounter (Signed)
Bernita Raisin PA, key [id number], G43.119. Triptans are contraindicated in uncontrolled HTN. Your information has been sent to [facility].

## 2021-03-14 NOTE — Progress Notes (Signed)
° °  CC:  headaches  Follow-up Visit  Last visit: 12/02/20  Brief HPI: 76 year old female with a history of DM, HTN, HLD who follows in clinic for migraine headaches.  At her last visit she was started on Ajovy monthly and Ubrelvy as needed.  Interval History: Since her last visit her headaches are about the same. She continues to have a constant dull headache. States she never received a prescription for Ajovy or Bernita Raisin, so she has been taking diclofenac as needed.  She had four visual auras last month. Diclofenac helps reduce the headache if she takes it early enough.  Headache days per month: 30 Headache free days per month: 0  Current Headache Regimen: Preventative: none Abortive: diclofenac 50-100 mg PRN   Prior Therapies                                  Verapamil 180 mg daily Losartan 100 mg daily Lexapro 15 mg daily Tizanidine 2 mg PRN  Physical Exam:   Vital Signs: BP (!) 160/86    Pulse 65    Ht 5\' 1"  (1.549 m)    Wt 134 lb (60.8 kg)    BMI 25.32 kg/m  GENERAL:  well appearing, in no acute distress, alert  SKIN:  Color, texture, turgor normal. No rashes or lesions HEAD:  Normocephalic/atraumatic. RESP: normal respiratory effort MSK:  No gross joint deformities.   NEUROLOGICAL: Mental Status: Alert, oriented to person, place and time, Follows commands, and Speech fluent and appropriate. Cranial Nerves: PERRL, face symmetric, no dysarthria, hearing grossly intact Motor: moves all extremities equally Gait: normal-based.  IMPRESSION: 76 year old female with a history of DM, HTN, HLD who presents for follow up of migraines. She has not started 61 as she states she never received a prescription. Will re-send prescriptions today. Would avoid Topamax due to cognitive side effects, and would avoid TCA, SNRI, or beta blocker as these would interact with her blood pressure and antidepressant medications. Triptans are contraindicated in uncontrolled  HTN.  PLAN: -Preventive: Start Ajovy 225 mg monthly -Rescue: Start Ubrelvy 50 mg PRN, continue diclofenac 50-100 mg PRN   Follow-up: 3 months  I spent a total of 21 minutes on the date of the service. Headache education was done. Discussed treatment options including preventive and acute medications. Discussed medication overuse headache and to limit use of acute treatments to no more than 2 days/week or 10 days/month. Discussed medication side effects, adverse reactions and drug interactions. Written educational materials and patient instructions outlining all of the above were given.  [place] MD 03/14/21 11:17 AM

## 2021-03-14 NOTE — Patient Instructions (Addendum)
Take Ajovy monthly for headache prevention  Take Bernita Raisin as needed for breakthrough headaches. You can take diclofenac at the same time as Bernita Raisin if headache does not resolve with Bernita Raisin alone

## 2021-03-15 ENCOUNTER — Encounter: Payer: Self-pay | Admitting: *Deleted

## 2021-03-15 NOTE — Telephone Encounter (Signed)
Roselyn Meier PA Case: [id number], Approved, Coverage Starts on: 01/30/2021 12:00:00 AM, Coverage Ends on: 01/29/2022 12:00:00 AM. Questions? Contact [phone]. Sent my chart to inform patient.

## 2021-03-29 DIAGNOSIS — D225 Melanocytic nevi of trunk: Secondary | ICD-10-CM | POA: Diagnosis not present

## 2021-03-29 DIAGNOSIS — L570 Actinic keratosis: Secondary | ICD-10-CM | POA: Diagnosis not present

## 2021-03-29 DIAGNOSIS — Z85828 Personal history of other malignant neoplasm of skin: Secondary | ICD-10-CM | POA: Diagnosis not present

## 2021-03-29 DIAGNOSIS — L821 Other seborrheic keratosis: Secondary | ICD-10-CM | POA: Diagnosis not present

## 2021-03-29 DIAGNOSIS — L720 Epidermal cyst: Secondary | ICD-10-CM | POA: Diagnosis not present

## 2021-03-29 DIAGNOSIS — L245 Irritant contact dermatitis due to other chemical products: Secondary | ICD-10-CM | POA: Diagnosis not present

## 2021-03-29 DIAGNOSIS — D1801 Hemangioma of skin and subcutaneous tissue: Secondary | ICD-10-CM | POA: Diagnosis not present

## 2021-04-19 ENCOUNTER — Telehealth: Payer: Self-pay

## 2021-04-19 ENCOUNTER — Other Ambulatory Visit: Payer: Self-pay | Admitting: Psychiatry

## 2021-04-19 MED ORDER — EMGALITY 120 MG/ML ~~LOC~~ SOAJ: 2.0000 "pen " | Freq: Once | SUBCUTANEOUS | 0 refills | Status: AC

## 2021-04-19 MED ORDER — EMGALITY 120 MG/ML ~~LOC~~ SOAJ: 1.0000 "pen " | SUBCUTANEOUS | 3 refills | Status: AC

## 2021-04-19 NOTE — Telephone Encounter (Addendum)
Madison Wilson has been denied. Pt would need to try aimovig or emgality first.  ?

## 2021-04-19 NOTE — Telephone Encounter (Signed)
Rx for Emgality sent to her pharmacy, thanks

## 2021-04-19 NOTE — Telephone Encounter (Signed)
PA for ajovy has been sent via cmm. Questionable if coverage will be approved. May have to try aimovig or emgality first.  ? (Key: [id number]) ? ?Your information has been submitted to [facility]. [employer] will review the request and will issue a decision, typically within 3-7 days from your submission. You can check the updated outcome later by reopening this request. ? ?If [employer] has not responded in 3-7 days or if you have any questions about your ePA request, please contact [employer] at [phone]. If you think there may be a problem with your PA request, use our live chat feature at the bottom right. ? ?For [place] requests, please call [phone]. ?

## 2021-04-20 ENCOUNTER — Encounter: Payer: Self-pay | Admitting: *Deleted

## 2021-04-20 ENCOUNTER — Other Ambulatory Visit: Payer: Self-pay | Admitting: Psychiatry

## 2021-04-20 ENCOUNTER — Telehealth: Payer: Self-pay | Admitting: *Deleted

## 2021-04-20 DIAGNOSIS — G2581 Restless legs syndrome: Secondary | ICD-10-CM | POA: Diagnosis not present

## 2021-04-20 DIAGNOSIS — Z1389 Encounter for screening for other disorder: Secondary | ICD-10-CM | POA: Diagnosis not present

## 2021-04-20 DIAGNOSIS — K219 Gastro-esophageal reflux disease without esophagitis: Secondary | ICD-10-CM | POA: Diagnosis not present

## 2021-04-20 DIAGNOSIS — E7800 Pure hypercholesterolemia, unspecified: Secondary | ICD-10-CM | POA: Diagnosis not present

## 2021-04-20 DIAGNOSIS — I10 Essential (primary) hypertension: Secondary | ICD-10-CM | POA: Diagnosis not present

## 2021-04-20 DIAGNOSIS — G4700 Insomnia, unspecified: Secondary | ICD-10-CM | POA: Diagnosis not present

## 2021-04-20 DIAGNOSIS — E1169 Type 2 diabetes mellitus with other specified complication: Secondary | ICD-10-CM | POA: Diagnosis not present

## 2021-04-20 DIAGNOSIS — Z0000 Encounter for general adult medical examination without abnormal findings: Secondary | ICD-10-CM | POA: Diagnosis not present

## 2021-04-20 DIAGNOSIS — F419 Anxiety disorder, unspecified: Secondary | ICD-10-CM | POA: Diagnosis not present

## 2021-04-20 MED ORDER — DIVALPROEX SODIUM 250 MG PO DR TAB: 250.0000 mg | DELAYED_RELEASE_TABLET | Freq: Every day | ORAL | 3 refills | Status: DC

## 2021-04-20 NOTE — Telephone Encounter (Signed)
Emgality PA, key [id number], [id number].  ?Your information has been sent to [facility]. ?

## 2021-04-20 NOTE — Telephone Encounter (Signed)
I sent in an rx for Depakote to her pharmacy

## 2021-04-20 NOTE — Telephone Encounter (Signed)
Emgality denied. We cover this drug when our criteria are met. The unmet criteria are: has been unable to achieve at least a 2 day decrease in migraine headache days per month after trying (of at least 2 months) at least one of the following oral preventive medications: divalproex.  ?Sent to [facility].  ?

## 2021-06-14 DIAGNOSIS — L309 Dermatitis, unspecified: Secondary | ICD-10-CM | POA: Diagnosis not present

## 2021-06-20 DIAGNOSIS — L239 Allergic contact dermatitis, unspecified cause: Secondary | ICD-10-CM | POA: Diagnosis not present

## 2021-06-20 DIAGNOSIS — L309 Dermatitis, unspecified: Secondary | ICD-10-CM | POA: Diagnosis not present

## 2021-06-20 DIAGNOSIS — Z85828 Personal history of other malignant neoplasm of skin: Secondary | ICD-10-CM | POA: Diagnosis not present

## 2021-06-28 ENCOUNTER — Ambulatory Visit: Payer: Medicare PPO | Admitting: Psychiatry

## 2021-07-18 ENCOUNTER — Other Ambulatory Visit: Payer: Self-pay | Admitting: Psychiatry

## 2021-07-26 DIAGNOSIS — F419 Anxiety disorder, unspecified: Secondary | ICD-10-CM | POA: Diagnosis not present

## 2021-07-26 DIAGNOSIS — E7800 Pure hypercholesterolemia, unspecified: Secondary | ICD-10-CM | POA: Diagnosis not present

## 2021-07-26 DIAGNOSIS — I10 Essential (primary) hypertension: Secondary | ICD-10-CM | POA: Diagnosis not present

## 2021-07-26 DIAGNOSIS — K219 Gastro-esophageal reflux disease without esophagitis: Secondary | ICD-10-CM | POA: Diagnosis not present

## 2021-07-26 DIAGNOSIS — G2581 Restless legs syndrome: Secondary | ICD-10-CM | POA: Diagnosis not present

## 2021-07-26 DIAGNOSIS — E1169 Type 2 diabetes mellitus with other specified complication: Secondary | ICD-10-CM | POA: Diagnosis not present

## 2021-07-26 DIAGNOSIS — Z7984 Long term (current) use of oral hypoglycemic drugs: Secondary | ICD-10-CM | POA: Diagnosis not present

## 2021-07-26 DIAGNOSIS — N644 Mastodynia: Secondary | ICD-10-CM | POA: Diagnosis not present

## 2021-07-26 DIAGNOSIS — G4700 Insomnia, unspecified: Secondary | ICD-10-CM | POA: Diagnosis not present

## 2021-07-28 ENCOUNTER — Other Ambulatory Visit: Payer: Self-pay | Admitting: Family Medicine

## 2021-07-28 DIAGNOSIS — N644 Mastodynia: Secondary | ICD-10-CM

## 2021-08-01 ENCOUNTER — Ambulatory Visit
Admission: RE | Admit: 2021-08-01 | Discharge: 2021-08-01 | Disposition: A | Discharge status: Home / Self Care | Payer: Medicare PPO | Source: Ambulatory Visit | Attending: Family Medicine | Admitting: Family Medicine

## 2021-08-01 DIAGNOSIS — N644 Mastodynia: Secondary | ICD-10-CM | POA: Diagnosis not present

## 2021-08-01 DIAGNOSIS — R928 Other abnormal and inconclusive findings on diagnostic imaging of breast: Secondary | ICD-10-CM | POA: Diagnosis not present

## 2021-08-03 DIAGNOSIS — E119 Type 2 diabetes mellitus without complications: Secondary | ICD-10-CM | POA: Diagnosis not present

## 2021-08-03 DIAGNOSIS — H524 Presbyopia: Secondary | ICD-10-CM | POA: Diagnosis not present

## 2021-08-03 DIAGNOSIS — H52203 Unspecified astigmatism, bilateral: Secondary | ICD-10-CM | POA: Diagnosis not present

## 2021-08-03 DIAGNOSIS — H26492 Other secondary cataract, left eye: Secondary | ICD-10-CM | POA: Diagnosis not present

## 2021-10-14 ENCOUNTER — Other Ambulatory Visit: Payer: Self-pay | Admitting: Psychiatry

## 2021-11-02 DIAGNOSIS — Z23 Encounter for immunization: Secondary | ICD-10-CM | POA: Diagnosis not present

## 2021-11-18 ENCOUNTER — Other Ambulatory Visit: Payer: Self-pay | Admitting: Family Medicine

## 2021-11-18 DIAGNOSIS — Z1231 Encounter for screening mammogram for malignant neoplasm of breast: Secondary | ICD-10-CM

## 2022-01-11 ENCOUNTER — Ambulatory Visit
Admission: RE | Admit: 2022-01-11 | Discharge: 2022-01-11 | Disposition: A | Discharge status: Home / Self Care | Payer: Medicare PPO | Source: Ambulatory Visit | Attending: Family Medicine | Admitting: Family Medicine

## 2022-01-11 DIAGNOSIS — Z1231 Encounter for screening mammogram for malignant neoplasm of breast: Secondary | ICD-10-CM | POA: Diagnosis not present

## 2022-01-19 ENCOUNTER — Telehealth: Payer: Self-pay | Admitting: Psychiatry

## 2022-01-19 NOTE — Telephone Encounter (Signed)
Called pt. Informed her an appointment was needed with Dr. Delena Bali before she could get a refill. Pt said she didn't request a refill on medication and she still have some medication leftover. I informed pt she can just call the office if she wants to schedule an appointment.

## 2022-01-19 NOTE — Telephone Encounter (Signed)
Patient was given 30 day supply and noted about appointment

## 2022-02-09 DIAGNOSIS — Z03818 Encounter for observation for suspected exposure to other biological agents ruled out: Secondary | ICD-10-CM | POA: Diagnosis not present

## 2022-02-09 DIAGNOSIS — J069 Acute upper respiratory infection, unspecified: Secondary | ICD-10-CM | POA: Diagnosis not present

## 2022-02-11 ENCOUNTER — Other Ambulatory Visit: Payer: Self-pay | Admitting: Psychiatry

## 2022-03-02 DIAGNOSIS — G2581 Restless legs syndrome: Secondary | ICD-10-CM | POA: Diagnosis not present

## 2022-03-02 DIAGNOSIS — I10 Essential (primary) hypertension: Secondary | ICD-10-CM | POA: Diagnosis not present

## 2022-03-02 DIAGNOSIS — K219 Gastro-esophageal reflux disease without esophagitis: Secondary | ICD-10-CM | POA: Diagnosis not present

## 2022-03-02 DIAGNOSIS — E7800 Pure hypercholesterolemia, unspecified: Secondary | ICD-10-CM | POA: Diagnosis not present

## 2022-03-02 DIAGNOSIS — E1165 Type 2 diabetes mellitus with hyperglycemia: Secondary | ICD-10-CM | POA: Diagnosis not present

## 2022-03-02 DIAGNOSIS — F419 Anxiety disorder, unspecified: Secondary | ICD-10-CM | POA: Diagnosis not present

## 2022-05-12 ENCOUNTER — Other Ambulatory Visit: Payer: Self-pay | Admitting: Psychiatry

## 2022-05-15 NOTE — Telephone Encounter (Signed)
Last seen on 03/14/21 No follow up scheduled Last filled on 02/13/22 # 90 tablets (90 day supply) Rx denied pt has not scheduled follow up visit

## 2022-05-22 DIAGNOSIS — D225 Melanocytic nevi of trunk: Secondary | ICD-10-CM | POA: Diagnosis not present

## 2022-05-22 DIAGNOSIS — D224 Melanocytic nevi of scalp and neck: Secondary | ICD-10-CM | POA: Diagnosis not present

## 2022-05-22 DIAGNOSIS — L820 Inflamed seborrheic keratosis: Secondary | ICD-10-CM | POA: Diagnosis not present

## 2022-05-22 DIAGNOSIS — Z85828 Personal history of other malignant neoplasm of skin: Secondary | ICD-10-CM | POA: Diagnosis not present

## 2022-05-22 DIAGNOSIS — D485 Neoplasm of uncertain behavior of skin: Secondary | ICD-10-CM | POA: Diagnosis not present

## 2022-05-22 DIAGNOSIS — L821 Other seborrheic keratosis: Secondary | ICD-10-CM | POA: Diagnosis not present

## 2022-05-22 DIAGNOSIS — D2262 Melanocytic nevi of left upper limb, including shoulder: Secondary | ICD-10-CM | POA: Diagnosis not present

## 2022-05-22 DIAGNOSIS — L298 Other pruritus: Secondary | ICD-10-CM | POA: Diagnosis not present

## 2022-05-31 DIAGNOSIS — L988 Other specified disorders of the skin and subcutaneous tissue: Secondary | ICD-10-CM | POA: Diagnosis not present

## 2022-05-31 DIAGNOSIS — D485 Neoplasm of uncertain behavior of skin: Secondary | ICD-10-CM | POA: Diagnosis not present

## 2022-05-31 DIAGNOSIS — Z85828 Personal history of other malignant neoplasm of skin: Secondary | ICD-10-CM | POA: Diagnosis not present

## 2022-06-07 DIAGNOSIS — M859 Disorder of bone density and structure, unspecified: Secondary | ICD-10-CM | POA: Diagnosis not present

## 2022-06-07 DIAGNOSIS — Z8781 Personal history of (healed) traumatic fracture: Secondary | ICD-10-CM | POA: Diagnosis not present

## 2022-06-07 DIAGNOSIS — Z7983 Long term (current) use of bisphosphonates: Secondary | ICD-10-CM | POA: Diagnosis not present

## 2022-07-03 DIAGNOSIS — I10 Essential (primary) hypertension: Secondary | ICD-10-CM | POA: Diagnosis not present

## 2022-07-03 DIAGNOSIS — Z1331 Encounter for screening for depression: Secondary | ICD-10-CM | POA: Diagnosis not present

## 2022-07-03 DIAGNOSIS — E1169 Type 2 diabetes mellitus with other specified complication: Secondary | ICD-10-CM | POA: Diagnosis not present

## 2022-07-03 DIAGNOSIS — E1165 Type 2 diabetes mellitus with hyperglycemia: Secondary | ICD-10-CM | POA: Diagnosis not present

## 2022-07-03 DIAGNOSIS — F3342 Major depressive disorder, recurrent, in full remission: Secondary | ICD-10-CM | POA: Diagnosis not present

## 2022-07-03 DIAGNOSIS — Z0000 Encounter for general adult medical examination without abnormal findings: Secondary | ICD-10-CM | POA: Diagnosis not present

## 2022-07-03 DIAGNOSIS — G4700 Insomnia, unspecified: Secondary | ICD-10-CM | POA: Diagnosis not present

## 2022-07-03 DIAGNOSIS — E7800 Pure hypercholesterolemia, unspecified: Secondary | ICD-10-CM | POA: Diagnosis not present

## 2022-07-03 DIAGNOSIS — F419 Anxiety disorder, unspecified: Secondary | ICD-10-CM | POA: Diagnosis not present

## 2022-07-03 DIAGNOSIS — M8580 Other specified disorders of bone density and structure, unspecified site: Secondary | ICD-10-CM | POA: Diagnosis not present

## 2022-09-01 DIAGNOSIS — M7989 Other specified soft tissue disorders: Secondary | ICD-10-CM | POA: Diagnosis not present

## 2022-09-05 DIAGNOSIS — Z1211 Encounter for screening for malignant neoplasm of colon: Secondary | ICD-10-CM | POA: Diagnosis not present

## 2022-12-22 ENCOUNTER — Other Ambulatory Visit: Payer: Self-pay | Admitting: Family Medicine

## 2022-12-22 DIAGNOSIS — Z1231 Encounter for screening mammogram for malignant neoplasm of breast: Secondary | ICD-10-CM

## 2022-12-25 DIAGNOSIS — M65311 Trigger thumb, right thumb: Secondary | ICD-10-CM | POA: Diagnosis not present

## 2022-12-25 DIAGNOSIS — M65331 Trigger finger, right middle finger: Secondary | ICD-10-CM | POA: Diagnosis not present

## 2023-01-03 DIAGNOSIS — E1165 Type 2 diabetes mellitus with hyperglycemia: Secondary | ICD-10-CM | POA: Diagnosis not present

## 2023-01-03 DIAGNOSIS — E7800 Pure hypercholesterolemia, unspecified: Secondary | ICD-10-CM | POA: Diagnosis not present

## 2023-01-03 DIAGNOSIS — G43909 Migraine, unspecified, not intractable, without status migrainosus: Secondary | ICD-10-CM | POA: Diagnosis not present

## 2023-01-03 DIAGNOSIS — F3342 Major depressive disorder, recurrent, in full remission: Secondary | ICD-10-CM | POA: Diagnosis not present

## 2023-01-03 DIAGNOSIS — K219 Gastro-esophageal reflux disease without esophagitis: Secondary | ICD-10-CM | POA: Diagnosis not present

## 2023-01-03 DIAGNOSIS — I10 Essential (primary) hypertension: Secondary | ICD-10-CM | POA: Diagnosis not present

## 2023-01-03 DIAGNOSIS — F419 Anxiety disorder, unspecified: Secondary | ICD-10-CM | POA: Diagnosis not present

## 2023-01-25 ENCOUNTER — Ambulatory Visit
Admission: RE | Admit: 2023-01-25 | Discharge: 2023-01-25 | Disposition: A | Discharge status: Home / Self Care | Payer: Medicare PPO | Source: Ambulatory Visit | Attending: Family Medicine | Admitting: Family Medicine

## 2023-01-25 DIAGNOSIS — Z1231 Encounter for screening mammogram for malignant neoplasm of breast: Secondary | ICD-10-CM

## 2023-02-13 DIAGNOSIS — H524 Presbyopia: Secondary | ICD-10-CM | POA: Diagnosis not present

## 2023-02-13 DIAGNOSIS — H04123 Dry eye syndrome of bilateral lacrimal glands: Secondary | ICD-10-CM | POA: Diagnosis not present

## 2023-02-13 DIAGNOSIS — H52203 Unspecified astigmatism, bilateral: Secondary | ICD-10-CM | POA: Diagnosis not present

## 2023-02-13 DIAGNOSIS — E119 Type 2 diabetes mellitus without complications: Secondary | ICD-10-CM | POA: Diagnosis not present

## 2023-02-13 DIAGNOSIS — H26492 Other secondary cataract, left eye: Secondary | ICD-10-CM | POA: Diagnosis not present

## 2023-03-01 DIAGNOSIS — M65311 Trigger thumb, right thumb: Secondary | ICD-10-CM | POA: Diagnosis not present

## 2023-03-01 DIAGNOSIS — M65331 Trigger finger, right middle finger: Secondary | ICD-10-CM | POA: Diagnosis not present

## 2023-03-18 DIAGNOSIS — R051 Acute cough: Secondary | ICD-10-CM | POA: Diagnosis not present

## 2023-03-18 DIAGNOSIS — B349 Viral infection, unspecified: Secondary | ICD-10-CM | POA: Diagnosis not present

## 2023-03-18 DIAGNOSIS — J101 Influenza due to other identified influenza virus with other respiratory manifestations: Secondary | ICD-10-CM | POA: Diagnosis not present

## 2023-04-10 DIAGNOSIS — M25641 Stiffness of right hand, not elsewhere classified: Secondary | ICD-10-CM | POA: Diagnosis not present

## 2023-04-30 DIAGNOSIS — M25641 Stiffness of right hand, not elsewhere classified: Secondary | ICD-10-CM | POA: Diagnosis not present

## 2023-05-08 DIAGNOSIS — M25641 Stiffness of right hand, not elsewhere classified: Secondary | ICD-10-CM | POA: Diagnosis not present

## 2023-05-22 ENCOUNTER — Other Ambulatory Visit: Payer: Self-pay | Admitting: Obstetrics and Gynecology

## 2023-05-22 DIAGNOSIS — Z1231 Encounter for screening mammogram for malignant neoplasm of breast: Secondary | ICD-10-CM

## 2023-05-24 DIAGNOSIS — N6320 Unspecified lump in the left breast, unspecified quadrant: Secondary | ICD-10-CM | POA: Diagnosis not present

## 2023-05-28 ENCOUNTER — Other Ambulatory Visit: Payer: Self-pay | Admitting: Family Medicine

## 2023-05-28 DIAGNOSIS — N6320 Unspecified lump in the left breast, unspecified quadrant: Secondary | ICD-10-CM

## 2023-06-08 ENCOUNTER — Ambulatory Visit
Admission: RE | Admit: 2023-06-08 | Discharge: 2023-06-08 | Disposition: A | Discharge status: Home / Self Care | Source: Ambulatory Visit | Attending: Family Medicine | Admitting: Family Medicine

## 2023-06-08 DIAGNOSIS — N6320 Unspecified lump in the left breast, unspecified quadrant: Secondary | ICD-10-CM

## 2023-06-08 DIAGNOSIS — N6459 Other signs and symptoms in breast: Secondary | ICD-10-CM | POA: Diagnosis not present

## 2023-07-25 DIAGNOSIS — Z0000 Encounter for general adult medical examination without abnormal findings: Secondary | ICD-10-CM | POA: Diagnosis not present

## 2023-07-25 DIAGNOSIS — M8580 Other specified disorders of bone density and structure, unspecified site: Secondary | ICD-10-CM | POA: Diagnosis not present

## 2023-07-25 DIAGNOSIS — F419 Anxiety disorder, unspecified: Secondary | ICD-10-CM | POA: Diagnosis not present

## 2023-07-25 DIAGNOSIS — E7800 Pure hypercholesterolemia, unspecified: Secondary | ICD-10-CM | POA: Diagnosis not present

## 2023-07-25 DIAGNOSIS — I10 Essential (primary) hypertension: Secondary | ICD-10-CM | POA: Diagnosis not present

## 2023-07-25 DIAGNOSIS — E1169 Type 2 diabetes mellitus with other specified complication: Secondary | ICD-10-CM | POA: Diagnosis not present

## 2023-07-25 DIAGNOSIS — G2581 Restless legs syndrome: Secondary | ICD-10-CM | POA: Diagnosis not present

## 2023-07-25 DIAGNOSIS — K219 Gastro-esophageal reflux disease without esophagitis: Secondary | ICD-10-CM | POA: Diagnosis not present

## 2023-07-25 DIAGNOSIS — Z1331 Encounter for screening for depression: Secondary | ICD-10-CM | POA: Diagnosis not present

## 2023-07-26 DIAGNOSIS — E1165 Type 2 diabetes mellitus with hyperglycemia: Secondary | ICD-10-CM | POA: Diagnosis not present

## 2023-07-26 DIAGNOSIS — E7800 Pure hypercholesterolemia, unspecified: Secondary | ICD-10-CM | POA: Diagnosis not present

## 2023-07-30 DIAGNOSIS — L820 Inflamed seborrheic keratosis: Secondary | ICD-10-CM | POA: Diagnosis not present

## 2023-07-30 DIAGNOSIS — L570 Actinic keratosis: Secondary | ICD-10-CM | POA: Diagnosis not present

## 2023-07-30 DIAGNOSIS — L821 Other seborrheic keratosis: Secondary | ICD-10-CM | POA: Diagnosis not present

## 2023-07-30 DIAGNOSIS — D224 Melanocytic nevi of scalp and neck: Secondary | ICD-10-CM | POA: Diagnosis not present

## 2023-07-30 DIAGNOSIS — Z85828 Personal history of other malignant neoplasm of skin: Secondary | ICD-10-CM | POA: Diagnosis not present

## 2023-07-30 DIAGNOSIS — D225 Melanocytic nevi of trunk: Secondary | ICD-10-CM | POA: Diagnosis not present

## 2023-07-30 DIAGNOSIS — L720 Epidermal cyst: Secondary | ICD-10-CM | POA: Diagnosis not present

## 2023-07-30 DIAGNOSIS — D2362 Other benign neoplasm of skin of left upper limb, including shoulder: Secondary | ICD-10-CM | POA: Diagnosis not present

## 2023-07-30 DIAGNOSIS — D485 Neoplasm of uncertain behavior of skin: Secondary | ICD-10-CM | POA: Diagnosis not present

## 2023-08-02 DIAGNOSIS — E1165 Type 2 diabetes mellitus with hyperglycemia: Secondary | ICD-10-CM | POA: Diagnosis not present

## 2023-08-27 DIAGNOSIS — E1169 Type 2 diabetes mellitus with other specified complication: Secondary | ICD-10-CM | POA: Diagnosis not present

## 2023-09-11 DIAGNOSIS — E1169 Type 2 diabetes mellitus with other specified complication: Secondary | ICD-10-CM | POA: Diagnosis not present

## 2023-12-03 DIAGNOSIS — Z23 Encounter for immunization: Secondary | ICD-10-CM | POA: Diagnosis not present

## 2023-12-03 DIAGNOSIS — S61011A Laceration without foreign body of right thumb without damage to nail, initial encounter: Secondary | ICD-10-CM | POA: Diagnosis not present

## 2023-12-10 ENCOUNTER — Other Ambulatory Visit: Payer: Self-pay

## 2023-12-13 DIAGNOSIS — E1169 Type 2 diabetes mellitus with other specified complication: Secondary | ICD-10-CM | POA: Diagnosis not present

## 2024-01-02 ENCOUNTER — Other Ambulatory Visit: Payer: Self-pay | Admitting: Family Medicine

## 2024-01-02 DIAGNOSIS — Z1231 Encounter for screening mammogram for malignant neoplasm of breast: Secondary | ICD-10-CM

## 2024-01-08 ENCOUNTER — Other Ambulatory Visit: Payer: Self-pay

## 2024-01-30 ENCOUNTER — Ambulatory Visit
Admission: RE | Admit: 2024-01-30 | Discharge: 2024-01-30 | Disposition: A | Discharge status: Home / Self Care | Source: Ambulatory Visit | Attending: Family Medicine | Admitting: Family Medicine

## 2024-01-30 DIAGNOSIS — Z1231 Encounter for screening mammogram for malignant neoplasm of breast: Secondary | ICD-10-CM
